# Patient Record
Sex: Female | Born: 1970 | Race: White | Hispanic: No | Marital: Married | Smoking: Never smoker
Health system: Southern US, Community
[De-identification: ages and names within clinical notes are randomized; demographics above are authoritative.]

## PROBLEM LIST (undated history)

## (undated) DIAGNOSIS — F32A Depression, unspecified: Secondary | ICD-10-CM

## (undated) DIAGNOSIS — G5600 Carpal tunnel syndrome, unspecified upper limb: Secondary | ICD-10-CM

## (undated) DIAGNOSIS — G43909 Migraine, unspecified, not intractable, without status migrainosus: Secondary | ICD-10-CM

## (undated) DIAGNOSIS — G4730 Sleep apnea, unspecified: Secondary | ICD-10-CM

## (undated) DIAGNOSIS — F329 Major depressive disorder, single episode, unspecified: Secondary | ICD-10-CM

## (undated) DIAGNOSIS — J302 Other seasonal allergic rhinitis: Secondary | ICD-10-CM

## (undated) HISTORY — DX: Sleep apnea, unspecified: G47.30

## (undated) HISTORY — DX: Carpal tunnel syndrome, unspecified upper limb: G56.00

## (undated) HISTORY — DX: Migraine, unspecified, not intractable, without status migrainosus: G43.909

---
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
## 2007-10-27 HISTORY — PX: CARPAL TUNNEL RELEASE: SHX101

## 2010-09-05 ENCOUNTER — Encounter: Admission: RE | Admit: 2010-09-05 | Discharge: 2010-09-05 | Payer: Self-pay | Admitting: Unknown Physician Specialty

## 2012-06-24 ENCOUNTER — Other Ambulatory Visit: Payer: Self-pay | Admitting: Family Medicine

## 2012-06-24 DIAGNOSIS — Z139 Encounter for screening, unspecified: Secondary | ICD-10-CM

## 2012-07-05 ENCOUNTER — Ambulatory Visit (INDEPENDENT_AMBULATORY_CARE_PROVIDER_SITE_OTHER): Payer: BC Managed Care – PPO | Admitting: Psychiatry

## 2012-07-05 ENCOUNTER — Encounter (HOSPITAL_COMMUNITY): Payer: Self-pay | Admitting: Psychiatry

## 2012-07-05 ENCOUNTER — Ambulatory Visit (INDEPENDENT_AMBULATORY_CARE_PROVIDER_SITE_OTHER): Payer: BC Managed Care – PPO

## 2012-07-05 ENCOUNTER — Other Ambulatory Visit (HOSPITAL_COMMUNITY): Payer: Self-pay | Admitting: Psychiatry

## 2012-07-05 VITALS — BP 128/86 | HR 72 | Ht 65.5 in | Wt 238.0 lb

## 2012-07-05 DIAGNOSIS — F4321 Adjustment disorder with depressed mood: Secondary | ICD-10-CM

## 2012-07-05 DIAGNOSIS — Z1231 Encounter for screening mammogram for malignant neoplasm of breast: Secondary | ICD-10-CM

## 2012-07-05 DIAGNOSIS — Z139 Encounter for screening, unspecified: Secondary | ICD-10-CM

## 2012-07-05 DIAGNOSIS — F4323 Adjustment disorder with mixed anxiety and depressed mood: Secondary | ICD-10-CM

## 2012-07-05 NOTE — Progress Notes (Signed)
Psychiatric Assessment Adult  Patient Identification:  Julie Schaef[place]  Date of Evaluation:  07/05/2012  Chief Complaint:  Chief Complaint  Patient presents with  . Medication Refill  . Fatigue  . Anxiety   History of Chief Complaint:   HPI Comments: Julie Schaefer  is a 41  y/o female with a past psychiatric history significant for Depression. NOS. The patient is referred for psychiatric services for psychiatric evaluation and medication management.   The patient reports that her main stressors are: "dad diagnosed with esophageal cancer"; "brother having lower back surgery"; "daughter moved out"; "job more demanding."   In the area of affective symptoms, patient appears anxious. Patient denies current suicidal ideation, intent, or plan. Patient denies current homicidal ideation, intent, or plan. Patient denies auditory hallucinations. Patient denies visual hallucinations. Patient denies symptoms of paranoia. Patient states sleep is poor, with approximately 7-8 hours of sleep per night. Appetite is good. Energy level is low. Patient denies symptoms of anhedonia. Patient denies hopelessness,but endorses helplessness, and guilt.   Denies any recent episodes consistent with mania, particularly decreased need for sleep with increased energy, grandiosity, impulsivity, hyperverbal and pressured speech, or increased productivity. Denies any recent symptoms consistent with psychosis, particularly auditory or visual hallucinations, thought broadcasting/insertion/withdrawal, or ideas of reference. Also denies excessive worry to the point of physical symptoms as well as any panic attacks. Denies any history of trauma or symptoms consistent with PTSD such as flashbacks, nightmares, hypervigilance, feelings of numbness or inability to connect with others.    Review of Systems  Constitutional: Positive for activity change. Negative for fever, chills, diaphoresis, appetite change, fatigue and unexpected weight  change.  Respiratory: Negative.   Cardiovascular: Positive for palpitations. Negative for chest pain and leg swelling.  Gastrointestinal: Negative.   Neurological: Positive for headaches. Negative for dizziness, tremors, seizures, syncope, facial asymmetry, speech difficulty, weakness, light-headedness and numbness.   Filed Vitals:   07/05/12 0922  BP: 128/86  Pulse: 72  Height: 5' 5.5" (1.664 m)  Weight: 238 lb (107.956 kg)   Physical Exam  Vitals reviewed. Constitutional: She appears well-developed and well-nourished. No distress.  Skin: She is not diaphoretic.    Traumatic Brain Injury: No  Past Psychiatric History: Diagnosis: Depression  Hospitalizations: Patient denies.  Outpatient Care: Patient denies.  Substance Abuse Care: Patient denies.  Self-Mutilation:Patient denies.  Suicidal Attempts: Patient denies.  Violent Behaviors: Patient denies.   Past Medical History:   Past Medical History  Diagnosis Date  . Carpal tunnel syndrome    History of Loss of Consciousness:  No Seizure History:  No Cardiac History:  No  Allergies:  No Known Allergies  Current Medications:  Current Outpatient Prescriptions  Medication Sig Dispense Refill  . cetirizine (ZYRTEC) 10 MG tablet Take 10 mg by mouth daily.      . citalopram (CELEXA) 40 MG tablet Take 40 mg by mouth Daily.      Marland Kitchen ibuprofen (ADVIL,MOTRIN) 200 MG tablet Take 200 mg by mouth every 6 (six) hours as needed.        Previous Psychotropic Medications:  Medication   Celexa- 1 year   Substance Abuse History in the last 12 months: Tobacco: Patient denies. Alcohol: Patient denies Illicit Drugs: Patient denies.  Medical Consequences of Substance Abuse: None  Legal Consequences of Substance Abuse: None  Family Consequences of Substance Abuse: None  Blackouts:  N/A DT's:  N/A Withdrawal Symptoms:  N/A  Social History: Current Place of Residence: [place], [place] Place of Birth: [place] Family  Members:Patient lives with her husband and 2 daughters.  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 Legal History: None Hobbies/Interests: Knitting. Taking care of everyone in her family.  Family History:   Family History  Problem Relation Age of Onset  . Esophageal cancer Father     Mental Status Examination/Evaluation: Objective:  Appearance: Casual and Well Groomed  Eye Contact::  Good  Speech:  Clear and Coherent and Normal Rate  Volume:  Normal  Mood:  "normal, easy going"  Affect:  Congruent and Full Range  Thought Process:  Coherent, Logical and Loose  Orientation:  Full  Thought Content:  WDL  Suicidal Thoughts:  No  Homicidal Thoughts:  No  Judgement:  Good  Insight:  Good  Psychomotor Activity:  Normal  Akathisia:  No  Handed:  Right  AIMS (if indicated):  Not indicated  Assets:  Communication Skills Desire for Improvement Financial Resources/Insurance Housing Intimacy Leisure Time Physical Health Resilience Social Support Talents/Skills Transportation Vocational/Educational    Laboratory/X-Ray Psychological Evaluation(s)   Normal  Normal   Assessment:  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 Carpal tunnel syndrome      AXIS IV problems with primary support group  AXIS V 51-60 moderate symptoms   Treatment Plan/Recommendations:  PLAN:  1. Affirm with the patient that the medications are taken as ordered. Patient expressed understanding of how their medications were to be used.  2. Continue the following psychiatric medications as written  prior to this appointment with the following changes:  a) Citalopram 40 mg daily 3. Therapy: brief supportive therapy provided. Continue current services.  4. Risks and benefits, side effects and alternatives discussed with patient, she was given an opportunity to ask questions about her medication, illness, and treatment. All current psychiatric medications have been reviewed and discussed with the patient and adjusted as clinically appropriate. The patient has been provided an accurate and updated list of the medications being now prescribed.  5. Patient told to call clinic if any problems occur. Patient advised to go to  ER  if s/he should develop SI/HI, side effects, or if symptoms worsen. Has crisis numbers to call if needed.   6. No labs warranted at this time.   7. The patient was encouraged to keep all PCP and specialty clinic appointments.  8. Patient was instructed to return to clinic in 1 month.  9. The patient was advised to call and cancel their mental health appointment within 24 hours of the appointment, if they are unable to keep the appointment, as well as the three no show and termination from clinic policy. 10. The patient expressed understanding of the plan and agrees with the above.   Jacqulyn Cane, MD 9/10/20139:15 AM

## 2012-07-06 LAB — CBC WITH DIFFERENTIAL/PLATELET
Basophils Absolute: 0 10*3/uL (ref 0.0–0.1)
Basophils Relative: 0 % (ref 0–1)
Eosinophils Absolute: 0.1 10*3/uL (ref 0.0–0.7)
Eosinophils Relative: 1 % (ref 0–5)
HCT: 41 % (ref 36.0–46.0)
MCH: 29.1 pg (ref 26.0–34.0)
MCHC: 33.4 g/dL (ref 30.0–36.0)
MCV: 87 fL (ref 78.0–100.0)
Monocytes Absolute: 0.3 10*3/uL (ref 0.1–1.0)
Platelets: 378 10*3/uL (ref 150–400)
RDW: 13.6 % (ref 11.5–15.5)
WBC: 5.6 10*3/uL (ref 4.0–10.5)

## 2012-07-06 LAB — VITAMIN D 25 HYDROXY (VIT D DEFICIENCY, FRACTURES): Vit D, 25-Hydroxy: 42 ng/mL (ref 30–89)

## 2012-07-06 LAB — THYROID PANEL WITH TSH
T3 Uptake: 35.3 % (ref 22.5–37.0)
T4, Total: 8.9 ug/dL (ref 5.0–12.5)

## 2012-07-07 DIAGNOSIS — F4321 Adjustment disorder with depressed mood: Secondary | ICD-10-CM | POA: Insufficient documentation

## 2012-07-09 LAB — VITAMIN D 1,25 DIHYDROXY: Vitamin D 1, 25 (OH)2 Total: 63 pg/mL (ref 18–72)

## 2012-07-12 ENCOUNTER — Telehealth (HOSPITAL_COMMUNITY): Payer: Self-pay | Admitting: Psychiatry

## 2012-07-12 NOTE — Telephone Encounter (Signed)
Called patient regarding lab results. Advised addition of Vitamin D3 1000 IU daily to augment antidepressant.

## 2012-07-18 ENCOUNTER — Telehealth (HOSPITAL_COMMUNITY): Payer: Self-pay | Admitting: Psychiatry

## 2012-07-18 NOTE — Telephone Encounter (Signed)
Erroneous encounter

## 2012-07-25 ENCOUNTER — Ambulatory Visit (INDEPENDENT_AMBULATORY_CARE_PROVIDER_SITE_OTHER): Payer: BC Managed Care – PPO | Admitting: Psychiatry

## 2012-07-25 ENCOUNTER — Encounter (HOSPITAL_COMMUNITY): Payer: Self-pay | Admitting: Psychiatry

## 2012-07-25 VITALS — BP 119/79 | HR 69 | Ht 65.5 in | Wt 240.0 lb

## 2012-07-25 DIAGNOSIS — F4323 Adjustment disorder with mixed anxiety and depressed mood: Secondary | ICD-10-CM

## 2012-07-25 DIAGNOSIS — F4321 Adjustment disorder with depressed mood: Secondary | ICD-10-CM

## 2012-07-25 NOTE — Progress Notes (Signed)
[facility] Follow-up Outpatient Visit  Julie Schaef[place] 1971-04-13  Date: 07/25/2012  Chief Complaint:  Follow up  History of Chief Complaint:   HPI Comments: Julie Schaefer is a 41 y/o female with a past psychiatric history significant for Depression. NOS. The patient is referred for psychiatric services for medication management.   The patient report that her father is doing well and continues chemotherapy. Her brother has completed his back surgery and is recovering and she is now working full time. She reports that she has some continue marital stressors. She also continues to have difficulty saying "no" and appears to get involved with too many activities giving herself little personal time.   In the area of affective symptoms, patient appears anxious. Patient denies current suicidal ideation, intent, or plan. Patient denies current homicidal ideation, intent, or plan. Patient denies auditory hallucinations. Patient denies visual hallucinations. Patient denies symptoms of paranoia. Patient states sleep is poor, with approximately 7-8 hours of sleep per night. Appetite is good. Energy level is low. Patient denies symptoms of anhedonia. Patient denies hopelessness, and helplessness, and endorses guilt.   Denies any recent episodes consistent with mania, particularly decreased need for sleep with increased energy, grandiosity, impulsivity, hyperverbal and pressured speech, or increased productivity. Denies any recent symptoms consistent with psychosis, particularly auditory or visual hallucinations, thought broadcasting/insertion/withdrawal, or ideas of reference. Also denies excessive worry to the point of physical symptoms as well as any panic attacks. Denies any history of trauma or symptoms consistent with PTSD such as flashbacks, nightmares, hypervigilance, feelings of numbness or inability to connect with others.   Review of Systems  Constitutional: Positive for activity change.  Negative for fever, chills, diaphoresis, appetite change, fatigue and unexpected weight change.  Respiratory: Negative.  Cardiovascular: Negative for chest pain, palpitations, and leg swelling.  Gastrointestinal: Negative.  Neurological: Positive for headaches. Negative for dizziness, tremors, seizures, syncope, facial asymmetry, speech difficulty, weakness, light-headedness and numbness.   Filed Vitals:   07/25/12 1011  BP: 119/79  Pulse: 69  Height: 5' 5.5" (1.664 m)  Weight: 240 lb (108.863 kg)   Physical Exam  Vitals reviewed.  Constitutional: She appears well-developed and well-nourished. No distress.  Skin: She is not diaphoretic.   Traumatic Brain Injury: No   Past Psychiatric History: Reviewed Diagnosis: Depression   Hospitalizations: Patient denies.   Outpatient Care: Patient denies.   Substance Abuse Care: Patient denies.   Self-Mutilation:Patient denies.   Suicidal Attempts: Patient denies.   Violent Behaviors: Patient denies.    Past Medical History: Reviewed Past Medical History   Diagnosis  Date   .  Carpal tunnel syndrome     History of Loss of Consciousness: No  Seizure History: No  Cardiac History: No   Allergies: No Known Allergies   Current Medications: Reviewed Current Outpatient Prescriptions   Medication  Sig  Dispense  Refill   .  cetirizine (ZYRTEC) 10 MG tablet  Take 10 mg by mouth daily.     .  citalopram (CELEXA) 40 MG tablet  Take 40 mg by mouth Daily.     Marland Kitchen  ibuprofen (ADVIL,MOTRIN) 200 MG tablet  Take 200 mg by mouth every 6 (six) hours as needed.      Previous Psychotropic Medications: Reviewed Medication   Celexa- 1 year    Substance Abuse History in the last 12 months: Reviewed Tobacco: Patient denies.  Alcohol: Patient denies  Illicit Drugs: Patient denies.   Medical Consequences of Substance Abuse: None  Legal Consequences of  Substance Abuse: None  Family Consequences of Substance Abuse: None  Blackouts: N/A  DT's: N/A    Withdrawal Symptoms: N/A   Social History: Reviewed Current Place of Residence: [place], [place]  Place of Birth: [place], [place]  Family Members:Patient lives with her husband and 2 daughters. 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  Legal History: None  Hobbies/Interests: Knitting. Taking care of everyone in her family.   Family History: Reviewed Family History   Problem  Relation  Age of Onset   .  Esophageal cancer  Father     Mental Status Examination/Evaluation:  Objective: Appearance: Casual and Well Groomed   Eye Contact:: Good   Speech: Clear and Coherent and Normal Rate   Volume: Normal   Mood: "good"   Affect: Congruent and Full Range   Thought Process: Coherent, Logical and Loose   Orientation: Full   Thought Content: WDL   Suicidal Thoughts: No   Homicidal Thoughts: No   Judgement: Good   Insight: Good   Psychomotor Activity: Normal   Akathisia: No   Handed: Right   Memory; Intact immediate and recent.  Assets: Communication Skills  Desire for Improvement  Financial Resources/Insurance  Housing  Intimacy  Leisure Time  Physical Health  Resilience  Social Support  Talents/Skills  Transportation  Vocational/Educational    Laboratory/X-Ray  Psychological Evaluation(s)   Normal  Normal    Assessment:  AXIS I  Adjustment Disorder with Mixed Emotional Features   AXIS II  No diagnosis   AXIS III  Past Medical History    Diagnosis  Date    .  Carpal tunnel syndrome       AXIS IV  problems with primary support group   AXIS V  51-60 moderate symptoms    Treatment Plan/Recommendations:  PLAN:  1. Affirm with the patient that  the medications are taken as ordered. Patient expressed understanding of how their medications were to be used.  2. Continue the following psychiatric medications as written prior to this appointment with the following changes:  a) Citalopram 40 mg daily-Patient has a 90 day supply. 3. Therapy: brief supportive therapy provided. Continue current services.  4. Risks and benefits, side effects and alternatives discussed with patient, she was given an opportunity to ask questions about her medication, illness, and treatment. All current psychiatric medications have been reviewed and discussed with the patient and adjusted as clinically appropriate. The patient has been provided an accurate and updated list of the medications being now prescribed.  5. Patient told to call clinic if any problems occur. Patient advised to go to ER if she should develop SI/HI, side effects, or if symptoms worsen. Has crisis numbers to call if needed.  6. No labs warranted at this time.  7. The patient was encouraged to keep all PCP and specialty clinic appointments.  8. Patient was instructed to return to clinic in 1 month.  9. The patient was advised to call and cancel their mental health appointment within 24 hours of the appointment, if they are unable to keep the appointment, as well as the three no show and termination from clinic policy.  10. The patient expressed understanding of the plan and agrees with the above.  Jacqulyn Cane, MD

## 2012-08-26 ENCOUNTER — Encounter (HOSPITAL_COMMUNITY): Payer: Self-pay | Admitting: Psychiatry

## 2012-08-26 ENCOUNTER — Ambulatory Visit (INDEPENDENT_AMBULATORY_CARE_PROVIDER_SITE_OTHER): Payer: BC Managed Care – PPO | Admitting: Psychiatry

## 2012-08-26 VITALS — BP 134/93 | HR 69 | Ht 65.0 in | Wt 236.0 lb

## 2012-08-26 DIAGNOSIS — F4323 Adjustment disorder with mixed anxiety and depressed mood: Secondary | ICD-10-CM

## 2012-08-26 MED ORDER — CITALOPRAM HYDROBROMIDE 40 MG PO TABS: 40.0000 mg | ORAL_TABLET | Freq: Every day | ORAL | Status: DC

## 2012-08-26 NOTE — Progress Notes (Signed)
[facility] Follow-up Outpatient Visit  Julie Schaefer 1971/05/28  Date: 08/26/2012  Chief Complaint  Patient presents with  . Follow-up   History of Chief Complaint:   HPI Comments: Ms. Hantz is a 41 y/o female with a past psychiatric history significant for Depression. NOS. The patient is referred for psychiatric services for medication management.   The patient reports that her father and brother are doing well.  She reports that she continues to stretch herself which causes her distress at times.  She continues to have difficulty with limiting her involvement in the care of her family.  She states her husband continues to have some difficulty with her relationship with her friend.  She states she is concerned about her husband's interested in joining the seminary which would require him to move to [place].  She has decided to stay in [place], no matter what her husband decides as her job and family are in the area.  In the area of affective symptoms, patient appears mildly anxious. Patient denies current suicidal ideation, intent, or plan. Patient denies current homicidal ideation, intent, or plan. Patient denies auditory hallucinations. Patient denies visual hallucinations. Patient denies symptoms of paranoia. Patient states sleep is poor, with approximately 7-8 hours of sleep per night. Appetite is good. Energy level is low. Patient denies symptoms of anhedonia. Patient denies hopelessness, and helplessness, but endorses guilt.   Denies any recent episodes consistent with mania, particularly decreased need for sleep with increased energy, grandiosity, impulsivity, hyperverbal and pressured speech, or increased productivity. Denies any recent symptoms consistent with psychosis, particularly auditory or visual hallucinations, thought broadcasting/insertion/withdrawal, or ideas of reference. Also denies excessive worry to the point of physical symptoms as well as any panic  attacks. Denies any history of trauma or symptoms consistent with PTSD such as flashbacks, nightmares, hypervigilance, feelings of numbness or inability to connect with others.   Review of Systems  Constitutional: Positive for activity change. Negative for fever, chills, diaphoresis, appetite change, fatigue and unexpected weight change.  Respiratory: Negative.  Cardiovascular: Negative for chest pain, palpitations, and leg swelling.  Gastrointestinal: Negative.  Neurological: Positive for headaches. Negative for dizziness, tremors, seizures, syncope, facial asymmetry, speech difficulty, weakness, light-headedness and numbness.    Filed Vitals:   08/26/12 1009  BP: 134/93  Pulse: 69  Height: 5\' 5"  (1.651 m)  Weight: 236 lb (107.049 kg)   Physical Exam  Vitals reviewed.  Constitutional: She appears well-developed and well-nourished. No distress.  Skin: She is not diaphoretic.  Traumatic Brain Injury: No   Past Psychiatric History: Reviewed  Diagnosis: Depression   Hospitalizations: Patient denies.   Outpatient Care: Patient denies.   Substance Abuse Care: Patient denies.   Self-Mutilation:Patient denies.   Suicidal Attempts: Patient denies.   Violent Behaviors: Patient denies.    Past Medical History: Reviewed  Past Medical History  Diagnosis Date  . Carpal tunnel syndrome    History of Loss of Consciousness: No  Seizure History: No  Cardiac History: No   Allergies: No Known Allergies   Current Medications: Reviewed  Current Outpatient Prescriptions on File Prior to Visit  Medication Sig Dispense Refill  . cetirizine (ZYRTEC) 10 MG tablet Take 10 mg by mouth daily.      . citalopram (CELEXA) 40 MG tablet Take 40 mg by mouth Daily.      Marland Kitchen ibuprofen (ADVIL,MOTRIN) 200 MG tablet Take 200 mg by mouth every 6 (six) hours as needed.      . naproxen sodium (  ANAPROX) 220 MG tablet Take 220 mg by mouth 2 (two) times daily between meals as needed.       . SUMAtriptan (IMITREX)  100 MG tablet Take 100 mg by mouth Once daily as needed.        Previous Psychotropic Medications: Reviewed  Medication   Celexa- 1 year    Substance Abuse History in the last 12 months: Reviewed  Tobacco: Patient denies.  Alcohol: Patient denies  Illicit Drugs: Patient denies.   Medical Consequences of Substance Abuse: None  Legal Consequences of Substance Abuse: None  Family Consequences of Substance Abuse: None  Blackouts: N/A  DT's: N/A  Withdrawal Symptoms: N/A   Social History: Reviewed  Current Place of Residence: [place], [place]  Place of Birth: [place], [place]  Family Members:Patient lives with her husband and 2 daughters. 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  Legal History: None  Hobbies/Interests: Knitting. Taking care of everyone in her family.   Family History: Reviewed  Family History  Problem Relation Age of Onset  . Esophageal cancer Father   . Stroke Father   . Coronary artery disease Father   . Hypertension Father   . Heart attack Father   . Depression Mother   . Carpal tunnel syndrome Daughter     Mental Status Examination/Evaluation:  Objective: Appearance: Casual and Well Groomed   Eye Contact:: Good   Speech: Clear and Coherent and Normal Rate   Volume: Normal   Mood: "good"   Affect: Congruent and Full Range   Thought Process: Coherent, Logical and Loose   Orientation: Full   Thought Content: WDL   Suicidal Thoughts: No   Homicidal Thoughts: No   Judgement: Good   Insight: Good   Psychomotor Activity: Normal   Akathisia: No   Handed: Right   Memory; Intact immediate and recent.   Assets: Communication  Skills  Desire for Improvement  Financial Resources/Insurance  Housing  Intimacy  Leisure Time  Physical Health  Resilience  Social Support  Talents/Skills  Transportation  Vocational/Educational    Laboratory/X-Ray  Psychological Evaluation(s)   Normal  Normal    Assessment:  AXIS I  Adjustment Disorder with Mixed Emotional Features   AXIS II  No diagnosis   AXIS III  Past Medical History    Diagnosis  Date    .  Carpal tunnel syndrome       AXIS IV  problems with primary support group   AXIS V  51-60 moderate symptoms   Treatment Plan/Recommendations:   PLAN:  1. Affirm with the patient that the medications are taken as ordered. Patient expressed understanding of how their medications were to be used.  2. Continue the following psychiatric medications as written prior to this appointment with the following changes:  a) Citalopram 40 mg daily-Patient has a 90 day supply.  3. Therapy: brief supportive therapy provided. Discussed psychosocial stressors. 4. Risks and benefits, side effects and alternatives discussed with patient, she was given an opportunity to ask questions about her medication, illness, and treatment. All current psychiatric medications have been reviewed and discussed with the patient and adjusted as clinically appropriate. The patient has been provided an accurate and updated list of the medications being now prescribed.  5. Patient told to call clinic if any problems occur. Patient advised to go to ER if she should develop SI/HI, side effects, or if symptoms worsen. Has crisis numbers to call if needed.  6. No labs warranted at this time.  7. The patient was encouraged to keep all PCP and specialty clinic appointments.  8. Patient was instructed to return to clinic in 1 month.  9. The patient was advised to call and cancel their mental health appointment within 24 hours of the appointment, if they are unable to keep the appointment, as well as the three no  show and termination from clinic policy.  10. The patient expressed understanding of the plan and agrees with the above.    Jacqulyn Cane, MD

## 2012-09-28 ENCOUNTER — Encounter (HOSPITAL_COMMUNITY): Payer: Self-pay | Admitting: Psychiatry

## 2012-09-28 ENCOUNTER — Ambulatory Visit (INDEPENDENT_AMBULATORY_CARE_PROVIDER_SITE_OTHER): Payer: BC Managed Care – PPO | Admitting: Psychiatry

## 2012-09-28 VITALS — BP 121/81 | HR 82 | Ht 65.0 in | Wt 240.0 lb

## 2012-09-28 DIAGNOSIS — F4323 Adjustment disorder with mixed anxiety and depressed mood: Secondary | ICD-10-CM

## 2012-09-28 MED ORDER — CITALOPRAM HYDROBROMIDE 40 MG PO TABS: 40.0000 mg | ORAL_TABLET | Freq: Every day | ORAL | Status: DC

## 2012-09-28 NOTE — Progress Notes (Signed)
[facility]  Julie Schaefer August 01, 1971  Date: 09/28/2012  Chief Complaint   Patient presents with   .  Follow-up    History of Chief Complaint:  HPI Comments: Julie Schaefer is a 41 y/o female with a past psychiatric history significant for Depression. NOS. The patient is referred for psychiatric services for medication management.   The patient reports that her father condition has shown some worsening but her brother is doing well. She endorses taking more time for herself. She states she is feeling a little less stressed.  She does endorse continued marital problems related to communication which has not been addressed. She states that her husband continues to endorse interest in seminary training which would take him from the area, but she has not decided whether she would go with him. She admits to several instances where a break down in their communication leads to both of them unintentionally yelling at each other.   In the area of affective symptoms, patient appears mildly anxious. Patient denies current suicidal ideation, intent, or plan. Patient denies current homicidal ideation, intent, or plan. Patient denies auditory hallucinations. Patient denies visual hallucinations. Patient denies symptoms of paranoia. Patient states sleep is poor, with approximately 9 hours of sleep per night. Appetite is good. Energy level is improved. Patient endorses some irritability.  Patient denies symptoms of anhedonia. Patient reports some hopelessness, and helplessness, but endorses guilt.   Denies any recent episodes consistent with mania, particularly decreased need for sleep with increased energy, grandiosity, impulsivity, hyperverbal and pressured speech, or increased productivity. Denies any recent symptoms consistent with psychosis, particularly auditory or visual hallucinations, thought broadcasting/insertion/withdrawal, or ideas of reference. Also denies excessive  worry to the point of physical symptoms as well as any panic attacks. Denies any history of trauma or symptoms consistent with PTSD such as flashbacks, nightmares, hypervigilance, feelings of numbness or inability to connect with others.   Review of Systems  Constitutional: Positive for activity change. Negative for fever, chills, diaphoresis, appetite change, fatigue and unexpected weight change.  Respiratory: Negative.  Cardiovascular: Negative for chest pain, palpitations, and leg swelling.  Gastrointestinal: Negative.  Neurological: Positive for headaches. Negative for dizziness, tremors, seizures, syncope, facial asymmetry, speech difficulty, weakness, light-headedness and numbness.   Filed Vitals:   09/28/12 1616  BP: 121/81  Pulse: 82  Height: 5\' 5"  (1.651 m)  Weight: 240 lb (108.863 kg)   Physical Exam  Vitals reviewed.  Constitutional: She appears well-developed and well-nourished. No distress.  Skin: She is not diaphoretic.  Traumatic Brain Injury: No   Past Psychiatric History: Reviewed  Diagnosis: Depression   Hospitalizations: Patient denies.   Outpatient Care: Patient denies.   Substance Abuse Care: Patient denies.   Self-Mutilation:Patient denies.   Suicidal Attempts: Patient denies.   Violent Behaviors: Patient denies.    Past Medical History: Reviewed  Past Medical History  Diagnosis Date  . Carpal tunnel syndrome   . Migraine    History of Loss of Consciousness: No  Seizure History: No  Cardiac History: No   Allergies: No Known Allergies   Current Medications: Reviewed  Current Outpatient Prescriptions on File Prior to Visit  Medication Sig Dispense Refill  . cetirizine (ZYRTEC) 10 MG tablet Take 10 mg by mouth daily.      . citalopram (CELEXA) 40 MG tablet Take 1 tablet (40 mg total) by mouth daily.  90 tablet  1  . ibuprofen (ADVIL,MOTRIN) 200 MG tablet Take 200 mg by mouth every  6 (six) hours as needed.      . naproxen sodium (ANAPROX) 220 MG  tablet Take 220 mg by mouth 2 (two) times daily between meals as needed.       . SUMAtriptan (IMITREX) 100 MG tablet Take 100 mg by mouth Once daily as needed.        Previous Psychotropic Medications: Reviewed  Medication   Celexa- 1 year    Substance Abuse History in the last 12 months: Reviewed  Tobacco: Patient denies.  Alcohol: Patient denies  Illicit Drugs: Patient denies.   Medical Consequences of Substance Abuse: None  Legal Consequences of Substance Abuse: None  Family Consequences of Substance Abuse: None  Blackouts: N/A  DT's: N/A  Withdrawal Symptoms: N/A   Social History: Reviewed  Current Place of Residence: [place], [place]  Place of Birth: [place], [place]  Family Members:Patient lives with her husband and 2 daughters. 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  Legal History: None  Hobbies/Interests: Knitting. Taking care of everyone in her family.   Family History: Reviewed  Family History  Problem Relation Age of Onset  . Esophageal cancer Father   . Stroke Father   . Coronary artery disease Father   . Hypertension Father   . Heart attack Father   . Depression Mother   . Carpal tunnel syndrome Daughter     Mental Status Examination/Evaluation:  Objective: Appearance: Casual and Well Groomed   Eye Contact:: Good   Speech: Clear and Coherent and Normal Rate   Volume: Normal   Mood: "okay"  3/10  Affect: Incongruent and Full Range   Thought Process: Coherent, Logical and Loose   Orientation: Full   Thought Content: WDL   Suicidal Thoughts: No   Homicidal Thoughts: No   Judgement: Good   Insight: Good   Psychomotor Activity:  Normal   Akathisia: No   Handed: Right   Memory: Intact immediate and recent.   Assets: Communication Skills  Desire for Improvement  Financial Resources/Insurance  Housing  Intimacy  Leisure Time  Physical Health  Resilience  Social Support  Talents/Skills  Transportation  Vocational/Educational    Laboratory/X-Ray  Psychological Evaluation(s)   Normal  Normal    Assessment:  AXIS I  Adjustment Disorder with Mixed Emotional Features   AXIS II  No diagnosis   AXIS III  Past Medical History    Diagnosis  Date    .  Carpal tunnel syndrome       AXIS IV  problems with primary support group   AXIS V  51-60 moderate symptoms    Treatment Plan/Recommendations:  PLAN:  1. Affirm with the patient that the medications are taken as ordered. Patient expressed understanding of how their medications were to be used.  2. Continue the following psychiatric medications as written prior to this appointment with the following changes:  a) Citalopram 40 mg daily-Patient has a 90 day supply.  3. Therapy: brief supportive therapy provided. Discussed psychosocial stressors. Discussed need for marital counseling to improve communication between the patient and her spouse as this is a significant stressor in the patient's life.  4. Risks and benefits, side effects and alternatives discussed with patient, she was given an opportunity to ask questions about  her medication, illness, and treatment. All current psychiatric medications have been reviewed and discussed with the patient and adjusted as clinically appropriate. The patient has been provided an accurate and updated list of the medications being now prescribed.  5. Patient told to call clinic if any problems occur. Patient advised to go to ER if she should develop SI/HI, side effects, or if symptoms worsen. Has crisis numbers to call if needed.  6. No labs warranted at this time.  7. The patient was encouraged to keep all PCP and specialty clinic  appointments.  8. Patient was instructed to return to clinic in 6 weeks.  9. The patient was advised to call and cancel their mental health appointment within 24 hours of the appointment, if they are unable to keep the appointment, as well as the three no show and termination from clinic policy.  10. The patient expressed understanding of the plan and agrees with the above.   Jacqulyn Cane, MD

## 2012-11-14 ENCOUNTER — Encounter (HOSPITAL_COMMUNITY): Payer: Self-pay | Admitting: Psychiatry

## 2012-11-14 ENCOUNTER — Ambulatory Visit (INDEPENDENT_AMBULATORY_CARE_PROVIDER_SITE_OTHER): Payer: BC Managed Care – PPO | Admitting: Psychiatry

## 2012-11-14 VITALS — BP 134/90 | HR 68 | Ht 65.0 in | Wt 241.0 lb

## 2012-11-14 DIAGNOSIS — F4323 Adjustment disorder with mixed anxiety and depressed mood: Secondary | ICD-10-CM

## 2012-11-14 DIAGNOSIS — F4321 Adjustment disorder with depressed mood: Secondary | ICD-10-CM

## 2012-11-14 NOTE — Progress Notes (Signed)
[facility] Follow-up Outpatient Visit  Julie Schaefer 1971/10/26  Date: 11/14/12  Chief Complaint   Patient presents with   .  Follow-up    History of Chief Complaint:  HPI Comments: Julie Schaefer is a 42 y/o female with a past psychiatric history significant for Depression. NOS. The patient is referred for psychiatric services for medication management.   The patient report her father passed away 10-14-2012, but the patient reports that she and his friend were there.  The patient states that she takes some solace in the fact that her father will no longer have to suffer physically. The patient states she is taking her medications an denies any side effects.   In the area of affective symptoms, patient appears mildly anxious. Patient denies current suicidal ideation, intent, or plan. Patient denies current homicidal ideation, intent, or plan. Patient denies auditory hallucinations. Patient denies visual hallucinations. Patient denies symptoms of paranoia. Patient states sleep is improved, with approximately 9 hours of sleep per night. Appetite is good. Energy level is low. Patient endorses less irritability.  Patient denies symptoms of anhedonia. Patient denieshopelessness, and helplessness, but guilt.   Denies any recent episodes consistent with mania, particularly decreased need for sleep with increased energy, grandiosity, impulsivity, hyperverbal and pressured speech, or increased productivity. Denies any recent symptoms consistent with psychosis, particularly auditory or visual hallucinations, thought broadcasting/insertion/withdrawal, or ideas of reference. Also denies excessive worry to the point of physical symptoms as well as any panic attacks. Denies any history of trauma or symptoms consistent with PTSD such as flashbacks, nightmares, hypervigilance, feelings of numbness or inability to connect with others.   Review of Systems  Constitutional: Positive for activity  change. Negative for fever, chills, diaphoresis, appetite change, fatigue and unexpected weight change.  Respiratory: Negative.  Cardiovascular: Negative for chest pain, palpitations, and leg swelling.  Gastrointestinal: Negative.  Neurological: Positive for headaches. Negative for dizziness, tremors, seizures, syncope, facial asymmetry, speech difficulty, weakness, light-headedness and numbness.   Filed Vitals:   11/14/12 1006  BP: 134/90  Pulse: 68  Height: 5\' 5"  (1.651 m)  Weight: 241 lb (109.317 kg)   Physical Exam  Vitals reviewed.  Constitutional: She appears well-developed and well-nourished. No distress.  Skin: She is not diaphoretic.  Traumatic Brain Injury: No   Past Psychiatric History: Reviewed  Diagnosis: Depression   Hospitalizations: Patient denies.   Outpatient Care: Patient denies.   Substance Abuse Care: Patient denies.   Self-Mutilation:Patient denies.   Suicidal Attempts: Patient denies.   Violent Behaviors: Patient denies.    Past Medical History: Reviewed  Past Medical History  Diagnosis Date  . Carpal tunnel syndrome   . Migraine    History of Loss of Consciousness: No  Seizure History: No  Cardiac History: No   Allergies: No Known Allergies   Current Medications: Reviewed  Current Outpatient Prescriptions on File Prior to Visit  Medication Sig Dispense Refill  . citalopram (CELEXA) 40 MG tablet Take 1 tablet (40 mg total) by mouth daily.  90 tablet  1  . ibuprofen (ADVIL,MOTRIN) 200 MG tablet Take 200 mg by mouth every 6 (six) hours as needed.      . cetirizine (ZYRTEC) 10 MG tablet Take 10 mg by mouth daily.      . SUMAtriptan (IMITREX) 100 MG tablet Take 100 mg by mouth Once daily as needed.        Previous Psychotropic Medications: Reviewed  Medication   Celexa- 1 year    Substance  Abuse History in the last 12 months: Reviewed  Tobacco: Patient denies.  Alcohol: Patient denies  Illicit Drugs: Patient denies.   Medical Consequences  of Substance Abuse: None  Legal Consequences of Substance Abuse: None  Family Consequences of Substance Abuse: None  Blackouts: N/A  DT's: N/A  Withdrawal Symptoms: N/A   Social History: Reviewed  Current Place of Residence: [place], [place]  Place of Birth: [place], [place]  Family Members:Patient lives with her husband and 2 daughters. 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  Legal History: None  Hobbies/Interests: Knitting. Taking care of everyone in her family.   Family History: Reviewed  Family History  Problem Relation Age of Onset  . Esophageal cancer Father   . Stroke Father   . Coronary artery disease Father   . Hypertension Father   . Heart attack Father   . Depression Mother   . Carpal tunnel syndrome Daughter     Mental Status Examination/Evaluation:  Objective: Appearance: Casual and Well Groomed   Eye Contact:: Good   Speech: Clear and Coherent and Normal Rate   Volume: Normal   Mood: "hurried"  5/10  Affect: Incongruent and Full Range   Thought Process: Coherent, Logical and Loose   Orientation: Full   Thought Content: WDL   Suicidal Thoughts: No   Homicidal Thoughts: No   Judgement: Good   Insight: Good   Psychomotor Activity: Normal   Akathisia: No   Handed: Right   Memory: Intact immediate and recent.   Assets: Communication Skills  Desire for Improvement  Financial Resources/Insurance  Housing  Intimacy  Leisure Time  Physical Health  Resilience  Social Support  Talents/Skills  Transportation  Vocational/Educational    Laboratory/X-Ray  Psychological Evaluation(s)   Normal  Normal    Assessment:  AXIS I   Adjustment Disorder with Mixed Emotional Features   AXIS II  No diagnosis   AXIS III  Past Medical History    Diagnosis  Date    .  Carpal tunnel syndrome       AXIS IV  problems with primary support group   AXIS V  51-60 moderate symptoms    Treatment Plan/Recommendations:  PLAN:  1. Affirm with the patient that the medications are taken as ordered. Patient expressed understanding of how their medications were to be used.  2. Continue the following psychiatric medications as written prior to this appointment with the following changes:  a) Citalopram 40 mg daily-Patient has a 90 day supply.  3. Therapy: brief supportive therapy provided. Discussed psychosocial stressors. Discussed need for marital counseling to improve communication between the patient and her spouse as this is a significant stressor in the patient's life.  4. Risks and benefits, side effects and alternatives discussed with patient, she was given an opportunity to ask questions about her medication, illness, and treatment. All current psychiatric medications have been reviewed and discussed with the patient and adjusted as clinically appropriate. The patient has been provided an accurate and updated list of the medications being now prescribed.  5. Patient told to call clinic if any problems occur. Patient advised to go to ER if she should develop SI/HI, side effects, or if symptoms worsen. Has crisis numbers to call if needed.  6. No labs warranted at this time.  7. The patient was encouraged to keep all PCP and specialty clinic appointments.  8. Patient was instructed to return to clinic in 8 weeks.  9. The patient was advised to call and cancel their mental health appointment within 24 hours of the appointment, if they are unable to keep the appointment, as well as the three no show and termination from clinic policy.  10. The patient expressed understanding of the plan and agrees with the above.   Jacqulyn Cane, MD

## 2013-01-11 ENCOUNTER — Ambulatory Visit (HOSPITAL_COMMUNITY): Payer: Self-pay | Admitting: Psychiatry

## 2013-01-17 ENCOUNTER — Encounter (HOSPITAL_COMMUNITY): Payer: Self-pay | Admitting: Psychiatry

## 2013-01-17 ENCOUNTER — Ambulatory Visit (INDEPENDENT_AMBULATORY_CARE_PROVIDER_SITE_OTHER): Payer: BC Managed Care – PPO | Admitting: Psychiatry

## 2013-01-17 VITALS — BP 125/86 | HR 84 | Ht 65.0 in | Wt 246.0 lb

## 2013-01-17 DIAGNOSIS — F4323 Adjustment disorder with mixed anxiety and depressed mood: Secondary | ICD-10-CM

## 2013-01-17 NOTE — Progress Notes (Signed)
[facility] Follow-up Outpatient Visit  Julie Schaefer 1971/04/25  Date: 01/17/2013  Chief Complaint   Patient presents with   .  Follow-up    History of Chief Complaint:  HPI Comments: Julie Schaefer is a 42 y/o female with a past psychiatric history significant for Depression. NOS. The patient is referred for psychiatric services for medication management.   The patient reports that she is still grieving her father's death. She feels continuously "rushed" in life but  She admits she seems to fill her time with few periods for rest and personal enjoyment. She reports she is taking her medications and denies any side effects.   In the area of affective symptoms, patient appears mildly anxious. Patient denies current suicidal ideation, intent, or plan. Patient denies current homicidal ideation, intent, or plan. Patient denies auditory hallucinations. Patient denies visual hallucinations. Patient denies symptoms of paranoia. Patient states sleep is improved, with approximately 8 hours of sleep per night. Appetite is increase. Energy level is low. Patient endorses some irritability.  Patient denies symptoms of anhedonia. Patient denies hopelessness, and helplessness, but guilt.   Denies any recent episodes consistent with mania, particularly decreased need for sleep with increased energy, grandiosity, impulsivity, hyperverbal and pressured speech, or increased productivity. Denies any recent symptoms consistent with psychosis, particularly auditory or visual hallucinations, thought broadcasting/insertion/withdrawal, or ideas of reference. Also denies excessive worry to the point of physical symptoms as well as any panic attacks. Denies any history of trauma or symptoms consistent with PTSD such as flashbacks, nightmares, hypervigilance, feelings of numbness or inability to connect with others.   Review of Systems  Constitutional: Positive for activity change. Negative for fever, chills,  diaphoresis, appetite change, fatigue and unexpected weight change.  Respiratory: Negative.  Cardiovascular: Negative for chest pain, palpitations, and leg swelling.  Gastrointestinal: Negative.  Neurological: Positive for headaches. Negative for dizziness, tremors, seizures, syncope, facial asymmetry, speech difficulty, weakness, light-headedness and numbness.   Filed Vitals:   01/17/13 1635  BP: 125/86  Pulse: 84  Height: 5\' 5"  (1.651 m)  Weight: 246 lb (111.585 kg)   Physical Exam  Vitals reviewed.  Constitutional: She appears well-developed and well-nourished. No distress.  Skin: She is not diaphoretic.  Traumatic Brain Injury: No   Past Psychiatric History: Reviewed  Diagnosis: Depression   Hospitalizations: Patient denies.   Outpatient Care: Patient denies.   Substance Abuse Care: Patient denies.   Self-Mutilation:Patient denies.   Suicidal Attempts: Patient denies.   Violent Behaviors: Patient denies.    Past Medical History: Reviewed  Past Medical History  Diagnosis Date  . Carpal tunnel syndrome   . Migraine    History of Loss of Consciousness: No  Seizure History: No  Cardiac History: No   Allergies: No Known Allergies   Current Medications: Reviewed  Current Outpatient Prescriptions on File Prior to Visit  Medication Sig Dispense Refill  . cetirizine (ZYRTEC) 10 MG tablet Take 10 mg by mouth daily.      . citalopram (CELEXA) 40 MG tablet Take 1 tablet (40 mg total) by mouth daily.  90 tablet  1  . SUMAtriptan (IMITREX) 100 MG tablet Take 100 mg by mouth Once daily as needed.       No current facility-administered medications on file prior to visit.    Previous Psychotropic Medications: Reviewed  Medication   Celexa- 1 year    Substance Abuse History in the last 12 months: Reviewed  Caffeine: Patient  Tobacco: Patient denies.  Alcohol: Patient denies  Illicit Drugs: Patient denies.   Medical Consequences of Substance Abuse: None  Legal  Consequences of Substance Abuse: None  Family Consequences of Substance Abuse: None  Blackouts: N/A  DT's: N/A  Withdrawal Symptoms: N/A   Social History: Reviewed  Current Place of Residence: [place], [place]  Place of Birth: [place], [place]  Family Members:Patient lives with her husband and 2 daughters. 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  Legal History: None  Hobbies/Interests: Knitting. Taking care of everyone in her family.   Family History: Reviewed  Family History  Problem Relation Age of Onset  . Esophageal cancer Father   . Stroke Father   . Coronary artery disease Father   . Hypertension Father   . Heart attack Father   . Depression Mother   . Carpal tunnel syndrome Daughter     Mental Status Examination/Evaluation:  Objective: Appearance: Casual and Well Groomed   Eye Contact:: Good   Speech: Clear and Coherent and Normal Rate   Volume: Normal   Mood: "anxious"  5/10  Affect: Incongruent and Full Range   Thought Process: Coherent, Logical and Loose   Orientation: Full   Thought Content: WDL   Suicidal Thoughts: No   Homicidal Thoughts: No   Judgement: Good   Insight: Good   Psychomotor Activity: Normal   Akathisia: No   Handed: Right   Memory: Intact immediate and recent. (donot   Assets: Communication Skills  Desire for Improvement  Financial Resources/Insurance  Housing  Intimacy  Leisure Time  Physical Health  Resilience  Social Support  Talents/Skills  Transportation  Vocational/Educational    Laboratory/X-Ray  Psychological Evaluation(s)   Normal  Normal    Assessment:  AXIS I  Adjustment Disorder with Mixed  Emotional Features   AXIS II  No diagnosis   AXIS III  Past Medical History    Diagnosis  Date    .  Carpal tunnel syndrome       AXIS IV  problems with primary support group   AXIS V  51-60 moderate symptoms    Treatment Plan/Recommendations:  PLAN:  1. Affirm with the patient that the medications are taken as ordered. Patient expressed understanding of how their medications were to be used.  2. Continue the following psychiatric medications as written prior to this appointment with the following changes:  a) Citalopram 40 mg daily-Patient has a 90 day supply. Will consider change of medications at next visit if there is no improvement. 3. Therapy: brief supportive therapy provided. Discussed psychosocial stressors. Discussed need for individual therapy 4. Risks and benefits, side effects and alternatives discussed with patient, she was given an opportunity to ask questions about her medication, illness, and treatment. All current psychiatric medications have been reviewed and discussed with the patient and adjusted as clinically appropriate. The patient has been provided an accurate and updated list of the medications being now prescribed.  5. Patient told to call clinic if any problems occur. Patient advised to go to ER if she should develop SI/HI, side effects, or if symptoms worsen. Has crisis numbers to call if needed.  6. No labs warranted at this time.  7. The patient was encouraged to keep all PCP and specialty clinic appointments.  8.  Patient was instructed to return to clinic in 4 weeks.  9. The patient was advised to call and cancel their mental health appointment within 24 hours of the appointment, if they are unable to keep the appointment, as well as the three no show and termination from clinic policy.  10. The patient expressed understanding of the plan and agrees with the above.   Jacqulyn Cane, M.D.  01/17/2013 4:36 PM

## 2013-01-20 ENCOUNTER — Encounter (HOSPITAL_COMMUNITY): Payer: Self-pay | Admitting: Psychiatry

## 2013-02-07 ENCOUNTER — Telehealth (HOSPITAL_COMMUNITY): Payer: Self-pay

## 2013-02-07 DIAGNOSIS — F4323 Adjustment disorder with mixed anxiety and depressed mood: Secondary | ICD-10-CM

## 2013-02-07 MED ORDER — BUPROPION HCL ER (XL) 150 MG PO TB24
150.0000 mg | ORAL_TABLET | ORAL | Status: DC
Start: 2013-02-07 — End: 2013-02-16

## 2013-02-07 NOTE — Telephone Encounter (Signed)
Would like to add medication that you had suggested/discussed

## 2013-02-07 NOTE — Telephone Encounter (Signed)
Will start bupropion XL 150 mg daily.

## 2013-02-16 ENCOUNTER — Ambulatory Visit (INDEPENDENT_AMBULATORY_CARE_PROVIDER_SITE_OTHER): Payer: BC Managed Care – PPO | Admitting: Psychiatry

## 2013-02-16 ENCOUNTER — Encounter (HOSPITAL_COMMUNITY): Payer: Self-pay | Admitting: Psychiatry

## 2013-02-16 VITALS — BP 133/88 | HR 85 | Ht 65.0 in | Wt 248.0 lb

## 2013-02-16 DIAGNOSIS — F4323 Adjustment disorder with mixed anxiety and depressed mood: Secondary | ICD-10-CM

## 2013-02-16 MED ORDER — BUPROPION HCL ER (XL) 150 MG PO TB24: 150.0000 mg | ORAL_TABLET | ORAL | Status: DC

## 2013-02-16 MED ORDER — CITALOPRAM HYDROBROMIDE 40 MG PO TABS: 40.0000 mg | ORAL_TABLET | Freq: Every day | ORAL | Status: DC

## 2013-02-16 NOTE — Progress Notes (Signed)
[facility] Behavioral Health Follow-up Outpatient Visit  Julie Schaefer 17-Aug-1971  Date: 02/16/2013  Chief Complaint   Patient presents with   .  Follow-up    History of Chief Complaint:  HPI Comments: Julie Schaefer is a 42 y/o female with a past psychiatric history significant for Depression. NOS. The patient is referred for psychiatric services for medication management.   The patient reports significant improvement with the addition of bupropion to her medication regimen. She reports some improvement to her libido. She reports she continues to grieve for her father, but her depressive symptoms have improved. 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 She reports she is taking her medications and denies any side effects.   In the area of affective symptoms, patient appears mildly anxious. Patient denies current suicidal ideation, intent, or plan. Patient denies current homicidal ideation, intent, or plan. Patient denies auditory hallucinations. Patient denies visual hallucinations. Patient denies symptoms of paranoia. Patient states sleep is improved, with approximately 8 hours of sleep per night. Appetite is increase. Energy level is improving. Patient endorses some a reduction in irritability.  Patient denies symptoms of anhedonia. Patient denies hopelessness, and helplessness, but guilt.   Denies any recent episodes consistent with mania, particularly decreased need for sleep with increased energy, grandiosity, impulsivity, hyperverbal and pressured speech, or increased productivity. Denies any recent symptoms consistent with psychosis, particularly auditory or visual hallucinations, thought broadcasting/insertion/withdrawal, or ideas of reference. Also denies excessive worry to the point of physical symptoms as well as any panic attacks. Denies any history of trauma or symptoms consistent with PTSD such as flashbacks, nightmares, hypervigilance, feelings of numbness or inability to connect  with others.   Review of Systems  Constitutional: Negative for fever, chills, weight loss and malaise/fatigue.  Respiratory: Negative for cough, sputum production, shortness of breath and wheezing.   Cardiovascular: Negative for chest pain, palpitations and leg swelling.  Gastrointestinal: Negative for heartburn, nausea, vomiting, abdominal pain, diarrhea and constipation.  Neurological: Negative for dizziness, speech change, focal weakness, seizures and loss of consciousness.     Filed Vitals:   02/16/13 1617  BP: 133/88  Pulse: 85  Height: 5\' 5"  (1.651 m)  Weight: 248 lb (112.492 kg)   Physical Exam  Vitals reviewed.  Constitutional: She appears well-developed and well-nourished. No distress.  Skin: She is not diaphoretic.  Traumatic Brain Injury: No   Past Psychiatric History: Reviewed  Diagnosis: Depression   Hospitalizations: Patient denies.   Outpatient Care: Patient denies.   Substance Abuse Care: Patient denies.   Self-Mutilation:Patient denies.   Suicidal Attempts: Patient denies.   Violent Behaviors: Patient denies.    Past Medical History: Reviewed  Past Medical History  Diagnosis Date  . Carpal tunnel syndrome   . Migraine    History of Loss of Consciousness: No  Seizure History: No  Cardiac History: No   Allergies: No Known Allergies   Current Medications: Reviewed  Current Outpatient Prescriptions on File Prior to Visit  Medication Sig Dispense Refill  . buPROPion (WELLBUTRIN XL) 150 MG 24 hr tablet Take 1 tablet (150 mg total) by mouth every morning.  30 tablet  1  . cetirizine (ZYRTEC) 10 MG tablet Take 10 mg by mouth daily.      . citalopram (CELEXA) 40 MG tablet Take 1 tablet (40 mg total) by mouth daily.  90 tablet  1  . lansoprazole (PREVACID) 30 MG capsule Take 30 mg by mouth daily.      . Naproxen Sodium (  ALEVE) 220 MG CAPS Take 220 mg by mouth.      . SUMAtriptan (IMITREX) 100 MG tablet Take 100 mg by mouth Once daily as needed.       No  current facility-administered medications on file prior to visit.    Previous Psychotropic Medications: Reviewed  Medication   Celexa- 1 year    Substance Abuse History in the last 12 months: Reviewed  Caffeine: Patient reports 16 oz coffee, sweet tea 64 oz Tobacco: Patient denies.  Alcohol: Patient denies  Illicit Drugs: Patient denies.   Medical Consequences of Substance Abuse: None  Legal Consequences of Substance Abuse: None  Family Consequences of Substance Abuse: None  Blackouts: N/A  DT's: N/A  Withdrawal Symptoms: N/A   Social History: Reviewed  Current Place of Residence: [place], [place]  Place of Birth: [place], [place]  Family Members:Patient lives with her husband and 2 daughters. 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  Legal History: None  Hobbies/Interests: Knitting. Taking care of everyone in her family.   Family History: Reviewed  Family History  Problem Relation Age of Onset  . Esophageal cancer Father   . Stroke Father   . Coronary artery disease Father   . Hypertension Father   . Heart attack Father   . Depression Mother   . Carpal tunnel syndrome Daughter     Psychiatric Specialty Examination:  Objective: Appearance: Casual and Well Groomed   Eye Contact:: Good   Speech: Clear and Coherent and Normal Rate   Volume: Normal   Mood: "okay"  5/10  (0=Very depressed; 5=Neutral; 10=Very Happy)   Affect: Incongruent and Full Range   Thought Process: Coherent, Logical and Loose   Orientation: Full   Thought Content: WDL   Suicidal Thoughts: No   Homicidal Thoughts: No   Judgement: Good   Insight: Good   Psychomotor  Activity: Normal   Akathisia: No   Handed: Right   Memory: Intact immediate and recent. (donot   Assets: Communication Skills  Desire for Improvement  Financial Resources/Insurance  Housing  Intimacy  Leisure Time  Physical Health  Resilience  Social Support  Talents/Skills  Transportation  Vocational/Educational    Laboratory/X-Ray  Psychological Evaluation(s)   Normal  Normal    Assessment:  AXIS I  Adjustment Disorder with Mixed Emotional Features   AXIS II  No diagnosis   AXIS III  Past Medical History    Diagnosis  Date    .  Carpal tunnel syndrome       AXIS IV  problems with primary support group   AXIS V  51-60 moderate symptoms    Treatment Plan/Recommendations:  PLAN:  1. Affirm with the patient that the medications are taken as ordered. Patient expressed understanding of how their medications were to be used.  2. Continue the following psychiatric medications as written prior to this appointment with the following changes:  a) Citalopram 40 mg daily-Patient has a 90 day supply. Will consider change of medications at next visit if there is no improvement. B) bupropion XL 150 mg-continue this dose until next visit. 3. Therapy: brief supportive therapy provided. Discussed psychosocial stressors. Discussed need for individual therapy 4. Risks and benefits, side effects and alternatives discussed with patient,  she was given an opportunity to ask questions about her medication, illness, and treatment. All current psychiatric medications have been reviewed and discussed with the patient and adjusted as clinically appropriate. The patient has been provided an accurate and updated list of the medications being now prescribed.  5. Patient told to call clinic if any problems occur. Patient advised to go to ER if she should develop SI/HI, side effects, or if symptoms worsen. Has crisis numbers to call if needed.  6. No labs warranted at this time.  7. The patient was encouraged  to keep all PCP and specialty clinic appointments.  8. Patient was instructed to return to clinic in 4-6 weeks.  9. The patient was advised to call and cancel their mental health appointment within 24 hours of the appointment, if they are unable to keep the appointment, as well as the three no show and termination from clinic policy.  10. The patient expressed understanding of the plan and agrees with the above.   Jacqulyn Cane, M.D.  02/16/2013 4:17 PM

## 2013-04-26 ENCOUNTER — Ambulatory Visit (INDEPENDENT_AMBULATORY_CARE_PROVIDER_SITE_OTHER): Payer: BC Managed Care – PPO | Admitting: Psychiatry

## 2013-04-26 ENCOUNTER — Encounter (HOSPITAL_COMMUNITY): Payer: Self-pay | Admitting: Psychiatry

## 2013-04-26 VITALS — BP 117/85 | HR 77 | Ht 65.0 in | Wt 248.0 lb

## 2013-04-26 DIAGNOSIS — F4323 Adjustment disorder with mixed anxiety and depressed mood: Secondary | ICD-10-CM

## 2013-04-26 MED ORDER — BUPROPION HCL ER (XL) 150 MG PO TB24: 150.0000 mg | ORAL_TABLET | Freq: Two times a day (BID) | ORAL | Status: DC

## 2013-04-26 MED ORDER — CITALOPRAM HYDROBROMIDE 40 MG PO TABS: 40.0000 mg | ORAL_TABLET | Freq: Every day | ORAL | Status: DC

## 2013-04-26 NOTE — Progress Notes (Signed)
[facility] Behavioral Health Follow-up Outpatient Visit  Julie Schaefer October 11, 1971  Date: 04/26/2013  Chief Complaint   Patient presents with   .  Follow-up    History of Chief Complaint:  HPI Comments: [name] is a 42 y/o female with a past psychiatric history significant for Depression. NOS. The patient is referred for psychiatric services for medication management.   The patient reports that she has been at home.  She was supposed to go for a sleep study and didn't go yet.  She has some marital stressors.  She reports she is taking her medications and denies any side effects.   In the area of affective symptoms, patient appears mildly anxious and depressed. Patient denies current suicidal ideation, intent, or plan. Patient denies current homicidal ideation, intent, or plan. Patient denies auditory hallucinations. Patient denies visual hallucinations. Patient denies symptoms of paranoia. Patient states sleep is poor, with approximately 8.5 hours of sleep per night. Appetite is increased. Energy level is improving but has been low overall. Patient reports some irritability.  Patient reports some symptoms of anhedonia. Patient denies hopelessness, and helplessness, but guilt.   Denies any recent episodes consistent with mania, particularly decreased need for sleep with increased energy, grandiosity, impulsivity, hyperverbal and pressured speech, or increased productivity. Denies any recent symptoms consistent with psychosis, particularly auditory or visual hallucinations, thought broadcasting/insertion/withdrawal, or ideas of reference. Also denies excessive worry to the point of physical symptoms as well as any panic attacks. Denies any history of trauma or symptoms consistent with PTSD such as flashbacks, nightmares, hypervigilance, feelings of numbness or inability to connect with others.   Review of Systems  Constitutional: Negative for fever, chills, weight loss and malaise/fatigue.  Respiratory:  Negative for cough, sputum production, shortness of breath and wheezing.   Cardiovascular: Negative for chest pain, palpitations and leg swelling.  Gastrointestinal: Negative for heartburn, nausea, vomiting, abdominal pain, diarrhea and constipation.  Neurological: Negative for dizziness, speech change, focal weakness, seizures and loss of consciousness.   Filed Vitals:   04/26/13 1031  BP: 117/85  Pulse: 77  Height: 5\' 5"  (1.651 m)  Weight: 248 lb (112.492 kg)   Physical Exam  Vitals reviewed.  Constitutional: She appears well-developed and well-nourished. No distress.  Skin: She is not diaphoretic.  Musculoskeletal: Strength & Muscle Tone: within normal limits Gait & Station: normal Patient leans: N/A  Traumatic Brain Injury: No   Past Psychiatric History: Reviewed  Diagnosis: Depression   Hospitalizations: Patient denies.   Outpatient Care: Patient denies.   Substance Abuse Care: Patient denies.   Self-Mutilation:Patient denies.   Suicidal Attempts: Patient denies.   Violent Behaviors: Patient denies.    Past Medical History: Reviewed  Past Medical History  Diagnosis Date  . Carpal tunnel syndrome   . Migraine    History of Loss of Consciousness: No  Seizure History: No  Cardiac History: No   Allergies: No Known Allergies   Current Medications: Reviewed  Current Outpatient Prescriptions on File Prior to Visit  Medication Sig Dispense Refill  . buPROPion (WELLBUTRIN XL) 150 MG 24 hr tablet Take 1 tablet (150 mg total) by mouth every morning.  30 tablet  1  . cetirizine (ZYRTEC) 10 MG tablet Take 10 mg by mouth daily.      . citalopram (CELEXA) 40 MG tablet Take 1 tablet (40 mg total) by mouth daily.  90 tablet  1  . SUMAtriptan (IMITREX) 100 MG tablet Take 100 mg by mouth Once daily as needed.  No current facility-administered medications on file prior to visit.    Previous Psychotropic Medications: Reviewed  Medication   Celexa- 1 year    Substance  Abuse History in the last 12 months: Reviewed  Caffeine: Patient reports 16 oz coffee, sweet tea 64 oz Tobacco: Patient denies.  Alcohol: Patient denies  Illicit Drugs: Patient denies.   Medical Consequences of Substance Abuse: None  Legal Consequences of Substance Abuse: None  Family Consequences of Substance Abuse: None  Blackouts: N/A  DT's: N/A  Withdrawal Symptoms: N/A   Social History: Reviewed  Current Place of Residence: [place], [place]  Place of Birth: [place], [place]  Family Members:Patient lives with her husband and 2 daughters. 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  Legal History: None  Hobbies/Interests: Knitting. Taking care of everyone in her family.    Family History: Reviewed  Family History  Problem Relation Age of Onset  . Esophageal cancer Father   . Stroke Father   . Coronary artery disease Father   . Hypertension Father   . Heart attack Father   . Depression Mother   . Carpal tunnel syndrome Daughter     Psychiatric Specialty Examination:  Objective: Appearance: Casual and Well Groomed   Eye Contact:: Good   Speech: Clear and Coherent and Normal Rate   Volume: Normal   Mood: "right now pissed off"  3-4/10  (0=Very depressed; 5=Neutral; 10=Very Happy)   Affect: Incongruent and Full Range   Thought Process: Coherent, Logical and Loose   Orientation: Full   Thought Content: WDL   Suicidal Thoughts: No   Homicidal Thoughts: No   Judgement: Good   Insight: Good   Psychomotor Activity: Normal   Akathisia: No   Handed: Right   Memory: Intact immediate and recent.  Assets: Communication Skills  Desire for Improvement   Financial Resources/Insurance  Housing  Intimacy  Leisure Time  Physical Health  Resilience  Social Support  Talents/Skills  Transportation  Vocational/Educational     Laboratory/X-Ray  Psychological Evaluation(s)   Normal  Normal    Assessment:  AXIS I  Adjustment Disorder with Mixed Emotional Features   AXIS II  No diagnosis   AXIS III  Past Medical History    Diagnosis  Date    .  Carpal tunnel syndrome       AXIS IV  problems with primary support group   AXIS V  51-60 moderate symptoms    Treatment Plan/Recommendations:  PLAN:  1. Affirm with the patient that the medications are taken as ordered. Patient expressed understanding of how their medications were to be used.  2. Continue the following psychiatric medications as written prior to this appointment with the following changes:  a) Citalopram 40 mg daily-Patient has a 90 day supply.  B) bupropion XL 150 mg-will increase to 150 mg twice daily, she was advised she could take this all in the morning as tolerated. 3. Therapy: brief supportive therapy provided. Discussed psychosocial stressors. Discussed need for individual therapy 4. Risks and benefits, side effects and alternatives discussed with patient, she was given an opportunity to ask questions about her medication, illness, and treatment. All current psychiatric medications have been reviewed and discussed with the patient and adjusted as clinically  appropriate. The patient has been provided an accurate and updated list of the medications being now prescribed.  5. Patient told to call clinic if any problems occur. Patient advised to go to ER if she should develop SI/HI, side effects, or if symptoms worsen. Has crisis numbers to call if needed.  6. No labs warranted at this time.  7. The patient was encouraged to keep all PCP and specialty clinic appointments. Advised her to talk to her PCP about a sleep study. She had not gone to her last scheduled appointment. 8.  Patient was instructed to return to clinic in 3 months.  9. The patient was advised to call and cancel their mental health appointment within 24 hours of the appointment, if they are unable to keep the appointment, as well as the three no show and termination from clinic policy.  10. The patient expressed understanding of the plan and agrees with the above.   Jacqulyn Cane, M.D.  04/26/2013 10:31 AM

## 2013-06-15 ENCOUNTER — Encounter (HOSPITAL_COMMUNITY): Payer: Self-pay | Admitting: Psychiatry

## 2013-06-15 ENCOUNTER — Ambulatory Visit (INDEPENDENT_AMBULATORY_CARE_PROVIDER_SITE_OTHER): Payer: BC Managed Care – PPO | Admitting: Psychiatry

## 2013-06-15 VITALS — BP 123/88 | HR 84 | Ht 65.0 in | Wt 252.0 lb

## 2013-06-15 DIAGNOSIS — F4321 Adjustment disorder with depressed mood: Secondary | ICD-10-CM

## 2013-06-15 DIAGNOSIS — F4323 Adjustment disorder with mixed anxiety and depressed mood: Secondary | ICD-10-CM

## 2013-06-15 MED ORDER — CITALOPRAM HYDROBROMIDE 40 MG PO TABS: 40.0000 mg | ORAL_TABLET | Freq: Every day | ORAL | Status: DC

## 2013-06-15 MED ORDER — BUPROPION HCL ER (XL) 150 MG PO TB24: 150.0000 mg | ORAL_TABLET | ORAL | Status: DC

## 2013-06-15 NOTE — Progress Notes (Signed)
[facility] Behavioral Health Follow-up Outpatient Visit  Julie Schaefer 30-Nov-1970  Date: 06/15/2013  Chief Complaint   Patient presents with   .  Follow-up    History of Chief Complaint:  HPI Comments: Julie Schaefer is a 42 y/o female with a past psychiatric history significant for Depression. NOS. The patient is referred for psychiatric services for medication management.   The patient reports that she has been at home until recently as the school year has started.  She reports feeling better emotionally, with more tolerance and less irritability.    She was supposed to go for a sleep study and didn't go yet.  She has some marital stressors.  She reports she is taking her medications and denies any side effects.   In the area of affective symptoms, patient appears mildly anxious and depressed. Patient denies current suicidal ideation, intent, or plan. Patient denies current homicidal ideation, intent, or plan. Patient denies auditory hallucinations. Patient denies visual hallucinations. Patient denies symptoms of paranoia. Patient states sleep is poor, with approximately 6-7 hours of sleep per night. Appetite is good. Energy level is improving but has been low overall. Patient reports some irritability.  Patient reports some symptoms of anhedonia. Patient denies hopelessness, and helplessness, but guilt.   Denies any recent episodes consistent with mania, particularly decreased need for sleep with increased energy, grandiosity, impulsivity, hyperverbal and pressured speech, or increased productivity. Denies any recent symptoms consistent with psychosis, particularly auditory or visual hallucinations, thought broadcasting/insertion/withdrawal, or ideas of reference. Also denies excessive worry to the point of physical symptoms as well as any panic attacks. Denies any history of trauma or symptoms consistent with PTSD such as flashbacks, nightmares, hypervigilance, feelings of numbness or inability to connect  with others.   Review of Systems  Constitutional: Negative for fever, chills, weight loss and malaise/fatigue.  Respiratory: Negative for cough, sputum production, shortness of breath and wheezing.   Cardiovascular: Negative for chest pain, palpitations and leg swelling.  Gastrointestinal: Negative for heartburn, nausea, vomiting, abdominal pain, diarrhea and constipation.  Neurological: Negative for dizziness, speech change, focal weakness, seizures and loss of consciousness.   Filed Vitals:   06/15/13 1619  Pulse: 84  Height: 5\' 5"  (1.651 m)  Weight: 252 lb (114.306 kg)   Physical Exam  Vitals reviewed.  Constitutional: She appears well-developed and well-nourished. No distress.  Skin: She is not diaphoretic.  Musculoskeletal: Strength & Muscle Tone: within normal limits Gait & Station: normal Patient leans: N/A  Traumatic Brain Injury: No   Past Psychiatric History: Reviewed  Diagnosis: Depression   Hospitalizations: Patient denies.   Outpatient Care: Patient denies.   Substance Abuse Care: Patient denies.   Self-Mutilation:Patient denies.   Suicidal Attempts: Patient denies.   Violent Behaviors: Patient denies.    Past Medical History: Reviewed  Past Medical History  Diagnosis Date  . Carpal tunnel syndrome   . Migraine    History of Loss of Consciousness: No  Seizure History: No  Cardiac History: No   Allergies: No Known Allergies   Current Medications: Reviewed  Current Outpatient Prescriptions on File Prior to Visit  Medication Sig Dispense Refill  . buPROPion (WELLBUTRIN XL) 150 MG 24 hr tablet Take 1 tablet (150 mg total) by mouth 2 (two) times daily.  60 tablet  1  . cetirizine (ZYRTEC) 10 MG tablet Take 10 mg by mouth daily.      . citalopram (CELEXA) 40 MG tablet Take 1 tablet (40 mg total) by mouth daily.  90 tablet  1  . PROAIR HFA 108 (90 BASE) MCG/ACT inhaler       . SUMAtriptan (IMITREX) 100 MG tablet Take 100 mg by mouth Once daily as needed.        No current facility-administered medications on file prior to visit.    Previous Psychotropic Medications: Reviewed  Medication   Celexa- 1 year    Substance Abuse History in the last 12 months: Reviewed  Caffeine: Patient sweet tea 48z Tobacco: Patient denies.  Alcohol: Patient denies  Illicit Drugs: Patient denies.   Medical Consequences of Substance Abuse: None  Legal Consequences of Substance Abuse: None  Family Consequences of Substance Abuse: None  Blackouts: N/A  DT's: N/A  Withdrawal Symptoms: N/A   Social History: Reviewed  Current Place of Residence: [place], [place]  Place of Birth: [place], [place]  Family Members:Patient lives with her husband and 2 daughters. 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  Legal History: None  Hobbies/Interests: Knitting. Taking care of everyone in her family.    Family History: Reviewed  Family History  Problem Relation Age of Onset  . Esophageal cancer Father   . Stroke Father   . Coronary artery disease Father   . Hypertension Father   . Heart attack Father   . Depression Mother   . Carpal tunnel syndrome Daughter     Psychiatric Specialty Examination:  Objective: Appearance: Casual and Well Groomed   Eye Contact:: Good   Speech: Clear and Coherent and Normal Rate   Volume: Normal   Mood: "it's good"  8/10  (0=Very depressed; 5=Neutral; 10=Very Happy)   Affect: Incongruent and Full Range   Thought Process: Coherent, Logical and Loose   Orientation: Full   Thought Content: WDL   Suicidal Thoughts: No   Homicidal Thoughts: No   Judgement: Good   Insight: Good   Psychomotor Activity: Normal    Akathisia: No   Handed: Right   Memory: Intact immediate and recent.  Assets: Communication Skills  Desire for Improvement  Financial Resources/Insurance  Housing  Intimacy  Leisure Time  Physical Health  Resilience  Social Support  Talents/Skills  Transportation  Vocational/Educational     Laboratory/X-Ray  Psychological Evaluation(s)   Normal  Normal    Assessment:  AXIS I  Adjustment Disorder with Mixed Emotional Features   AXIS II  No diagnosis   AXIS III  Past Medical History    Diagnosis  Date    .  Carpal tunnel syndrome       AXIS IV  problems with primary support group   AXIS V  51-60 moderate symptoms    Treatment Plan/Recommendations:  PLAN:  1. Affirm with the patient that the medications are taken as ordered. Patient expressed understanding of how their medications were to be used.  2. Continue the following psychiatric medications as written prior to this appointment with the following changes:  a) Citalopram 40 mg daily-Patient has a 90 day supply. No change in dosage. B) Bupropion XL 150 mg-will  150 mg twice daily, she was advised she could take this all in the morning as tolerated. No further increase at this time. 3. Therapy: brief supportive therapy provided. Discussed psychosocial stressors. Discussed need for individual therapy 4. Risks and  benefits, side effects and alternatives discussed with patient, she was given an opportunity to ask questions about her medication, illness, and treatment. All current psychiatric medications have been reviewed and discussed with the patient and adjusted as clinically appropriate. The patient has been provided an accurate and updated list of the medications being now prescribed.  5. Patient told to call clinic if any problems occur. Patient advised to go to ER if she should develop SI/HI, side effects, or if symptoms worsen. Has crisis numbers to call if needed.  6. No labs warranted at this time.  7. The patient  was encouraged to keep all PCP and specialty clinic appointments. Advised her to talk to her PCP about a sleep study. She had not gone to her last scheduled appointment. 8. Patient was instructed to return to clinic in 2 months.  9. The patient was advised to call and cancel their mental health appointment within 24 hours of the appointment, if they are unable to keep the appointment, as well as the three no show and termination from clinic policy.  10. The patient expressed understanding of the plan and agrees with the above.   Jacqulyn Cane, M.D.  06/15/2013 4:27 PM

## 2013-12-06 ENCOUNTER — Encounter (HOSPITAL_COMMUNITY): Payer: Self-pay | Admitting: Psychiatry

## 2013-12-06 ENCOUNTER — Ambulatory Visit (INDEPENDENT_AMBULATORY_CARE_PROVIDER_SITE_OTHER): Payer: BC Managed Care – PPO | Admitting: Psychiatry

## 2013-12-06 ENCOUNTER — Encounter (INDEPENDENT_AMBULATORY_CARE_PROVIDER_SITE_OTHER): Payer: Self-pay

## 2013-12-06 VITALS — BP 117/86 | HR 78 | Wt 255.0 lb

## 2013-12-06 DIAGNOSIS — F4321 Adjustment disorder with depressed mood: Secondary | ICD-10-CM

## 2013-12-06 DIAGNOSIS — F4323 Adjustment disorder with mixed anxiety and depressed mood: Secondary | ICD-10-CM

## 2013-12-06 MED ORDER — BUPROPION HCL ER (XL) 150 MG PO TB24: 150.0000 mg | ORAL_TABLET | ORAL | Status: DC

## 2013-12-06 MED ORDER — CITALOPRAM HYDROBROMIDE 40 MG PO TABS: 40.0000 mg | ORAL_TABLET | Freq: Every day | ORAL | Status: DC

## 2013-12-06 NOTE — Progress Notes (Signed)
[facility] Behavioral Health Follow-up Outpatient Visit  Julie Schaefer 01-Jan-1971   Date: 12/06/2013  Chief Complaint   Patient presents with   .  Follow-up    History of Chief Complaint:   HPI Comments: Julie Schaefer is a 43 y/o female with a past psychiatric history significant for Depression. NOS. The patient is referred for psychiatric services for medication management.   . Location: The patient reports her depression has worsened.  . Quality: The patient reports that she has been at home until recently as the school year has started.  She reports feeling better emotionally, with more tolerance and less irritability.   She was supposed to go for a sleep study and didn't go yet.  She has some marital stressors.  She reports she is taking her medications and denies any side effects.   In the area of affective symptoms, patient appears mildly anxious and depressed. Patient denies current suicidal ideation, intent, or plan. Patient denies current homicidal ideation, intent, or plan. Patient denies auditory hallucinations. Patient denies visual hallucinations. Patient denies symptoms of paranoia. Patient states sleep is better, with approximately 7 hours of sleep per night. Appetite is good. Energy level is improving but has been low overall. Patient reports some irritability.  Patient reports some symptoms of anhedonia. Patient denies hopelessness, and helplessness, but guilt.   . Severity: Depression: 3/10 (0=Very depressed; 5=Neutral; 10=Very Happy)  Anxiety- 5/10 (0=no anxiety; 5= moderate/tolerable anxiety; 10= panic attacks)  . Duration; Depression-since teenager  . Timing- Morning and evening.  . Context: Anniversary of death.  . Modifying factors:  . Associated signs and symptoms : Denies any recent episodes consistent with mania, particularly decreased need for sleep with increased energy, grandiosity, impulsivity, hyperverbal and pressured speech, or increased productivity.  Denies any recent symptoms consistent with psychosis, particularly auditory or visual hallucinations, thought broadcasting/insertion/withdrawal, or ideas of reference. Also denies excessive worry to the point of physical symptoms as well as any panic attacks. Denies any history of trauma or symptoms consistent with PTSD such as flashbacks, nightmares, hypervigilance, feelings of numbness or inability to connect with others.   Review of Systems  Constitutional: Negative for fever, chills, weight loss and malaise/fatigue.  Respiratory: Negative for cough, sputum production, shortness of breath and wheezing.   Cardiovascular: Negative for chest pain, palpitations and leg swelling.  Gastrointestinal: Negative for heartburn, nausea, vomiting, abdominal pain, diarrhea and constipation.  Neurological: Negative for dizziness, speech change, focal weakness, seizures and loss of consciousness.   Filed Vitals:   12/06/13 1530  BP: 117/86  Pulse: 78  Weight: 255 lb (115.667 kg)   Physical Exam  Vitals reviewed.  Constitutional: She appears well-developed and well-nourished. No distress.  Skin: She is not diaphoretic.  Musculoskeletal: Strength & Muscle Tone: within normal limits Gait & Station: normal Patient leans: N/A  Traumatic Brain Injury: No   Past Psychiatric History: Reviewed  Diagnosis: Depression   Hospitalizations: Patient denies.   Outpatient Care: Patient denies.   Substance Abuse Care: Patient denies.   Self-Mutilation:Patient denies.   Suicidal Attempts: Patient denies.   Violent Behaviors: Patient denies.    Past Medical History: Reviewed  Past Medical History  Diagnosis Date  . Carpal tunnel syndrome   . Migraine    History of Loss of Consciousness: No  Seizure History: No  Cardiac History: No   Allergies: No Known Allergies   Current Medications: Reviewed  Current Outpatient Prescriptions on File Prior to Visit  Medication Sig Dispense Refill  . buPROPion  (  WELLBUTRIN XL) 150 MG 24 hr tablet Take 1 tablet (150 mg total) by mouth every morning.  30 tablet  3  . cetirizine (ZYRTEC) 10 MG tablet Take 10 mg by mouth daily.      . citalopram (CELEXA) 40 MG tablet Take 1 tablet (40 mg total) by mouth daily.  90 tablet  1  . PROAIR HFA 108 (90 BASE) MCG/ACT inhaler       . SUMAtriptan (IMITREX) 100 MG tablet Take 100 mg by mouth Once daily as needed.       No current facility-administered medications on file prior to visit.    Previous Psychotropic Medications: Reviewed  Medication   Celexa- 1 year    Substance Abuse History in the last 12 months: Reviewed  Caffeine: Patient sweet tea 48z Tobacco: Patient denies.  Alcohol: Patient denies  Illicit Drugs: Patient denies.   Medical Consequences of Substance Abuse: None  Legal Consequences of Substance Abuse: None  Family Consequences of Substance Abuse: None  Blackouts: N/A  DT's: N/A  Withdrawal Symptoms: N/A   Social History: Reviewed  [place]idence[place], [place]  Place of Birth: [place], [place]  Family Members:Patient lives with her husband and 2 daughters. 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  Legal History: None  Hobbies/Interests: Knitting. Taking care of everyone in her family.    Family History: Reviewed  Family History  Problem Relation Age of Onset  . Esophageal cancer Father   . Stroke Father   . Coronary artery disease Father   . Hypertension Father   . Heart attack Father   . Depression Mother   . Carpal tunnel syndrome Daughter     Psychiatric Specialty Examination:  Objective: Appearance: Casual and Well Groomed   Eye  Contact:: Good   Speech: Clear and Coherent and Normal Rate   Volume: Normal   Mood: "it's whatever."   Affect: Incongruent and Full Range   Thought Process: Coherent, Logical and Loose   Orientation: Full   Thought Content: WDL   Suicidal Thoughts: No   Homicidal Thoughts: No   Judgement: Good   Insight: Good   Psychomotor Activity: Normal   Akathisia: No   Handed: Right   Memory: Intact immediate; recent.  Assets: Communication Skills  Desire for Improvement  Financial Resources/Insurance  Housing  Intimacy  Leisure Time  Physical Health  Resilience  Social Support  Talents/Skills  Transportation  Vocational/Educational     Laboratory/X-Ray  Psychological Evaluation(s)   Normal  Normal    Assessment:  Adjustment Disorder with Mixed Emotional Features-stable AXIS I  Adjustment Disorder with Mixed Emotional Features   AXIS II  No diagnosis   AXIS III  Past Medical History    Diagnosis  Date    .  Carpal tunnel syndrome       AXIS IV  problems with primary support group   AXIS V  51-60 moderate symptoms    Treatment Plan/Recommendations:  PLAN:  1. Affirm with the patient that the medications are taken as ordered. Patient expressed understanding of how their medications were to be used.  2. Continue the following psychiatric medications as written prior to this appointment with the following changes:  a) Citalopram 40 mg daily-Patient  has a 90 day supply. No change in dosage. B) Bupropion XL 150 mg- 3. Therapy: brief supportive therapy provided. Discussed psychosocial stressors. Discussed need for individual therapy. More than 50% of the visit was spent on individual therapy/counseling.  4. Risks and benefits, side effects and alternatives discussed with patient, she was given an opportunity to ask questions about her medication, illness, and treatment. All current psychiatric medications have been reviewed and discussed with the patient and adjusted as clinically  appropriate. The patient has been provided an accurate and updated list of the medications being now prescribed.  5. Patient told to call clinic if any problems occur. Patient advised to go to ER if she should develop SI/HI, side effects, or if symptoms worsen. Has crisis numbers to call if needed.  6. No labs warranted at this time.  7. The patient was encouraged to keep all PCP and specialty clinic appointments. Advised her to talk to her PCP about a sleep study. She had not gone to her last scheduled appointment. 8. Patient was instructed to return to clinic in 2 months.  9. The patient was advised to call and cancel their mental health appointment within 24 hours of the appointment, if they are unable to keep the appointment, as well as the three no show and termination from clinic policy.  10. The patient expressed understanding of the plan and agrees with the above.  Time spent: 30 minutes Jacqulyn Cane, M.D.  12/06/2013 3:28 PM

## 2014-01-17 ENCOUNTER — Ambulatory Visit (HOSPITAL_COMMUNITY): Payer: Self-pay | Admitting: Psychiatry

## 2014-02-08 ENCOUNTER — Other Ambulatory Visit (HOSPITAL_COMMUNITY): Payer: Self-pay | Admitting: Psychiatry

## 2014-06-15 ENCOUNTER — Encounter (INDEPENDENT_AMBULATORY_CARE_PROVIDER_SITE_OTHER): Payer: Self-pay

## 2014-06-15 ENCOUNTER — Encounter (HOSPITAL_COMMUNITY): Payer: Self-pay | Admitting: Psychiatry

## 2014-06-15 ENCOUNTER — Ambulatory Visit (INDEPENDENT_AMBULATORY_CARE_PROVIDER_SITE_OTHER): Payer: BC Managed Care – PPO | Admitting: Psychiatry

## 2014-06-15 VITALS — Ht 65.0 in | Wt 254.0 lb

## 2014-06-15 DIAGNOSIS — F4321 Adjustment disorder with depressed mood: Secondary | ICD-10-CM

## 2014-06-15 DIAGNOSIS — F4323 Adjustment disorder with mixed anxiety and depressed mood: Secondary | ICD-10-CM

## 2014-06-15 MED ORDER — BUPROPION HCL ER (XL) 150 MG PO TB24: 150.0000 mg | ORAL_TABLET | ORAL | Status: DC

## 2014-06-15 MED ORDER — CITALOPRAM HYDROBROMIDE 40 MG PO TABS: 40.0000 mg | ORAL_TABLET | Freq: Every day | ORAL | Status: DC

## 2014-06-15 NOTE — Progress Notes (Signed)
Patient ID: Julie Schaefer, female   DOB: [birth year], 43 y.o.   MRN: [id number]   [facility] Follow-up Outpatient Visit  Julie Schaefer Mar 09, 1971   Date: 06/15/2014  Chief Complaint   Patient presents with   .  Follow-up    History of Chief Complaint:   HPI Comments: [name] is a 43 y/o female with a past psychiatric history significant for Depression. NOS. The patient is referred for psychiatric services for medication management.   . Location: The patient reports her depression is better. Depression started after the death of her dad in 2012/03/08. She is also having some concerns with her daughter was also seeing psychiatry provider.  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   . Quality: Summer break as well as a little low far because of less activities but overall longer does help her depression. She has 3 kids. Relationship is going on fine. No reported side effects on Wellbutrin and Celexa. Some decreased libido on Celexa but Wellbutrin does help.  In the area of affective symptoms, patient appears mildly anxious and depressed. Patient denies current suicidal ideation, intent, or plan. Patient denies current homicidal ideation, intent, or plan. Patient denies auditory hallucinations. Patient denies visual hallucinations. Patient denies symptoms of paranoia. Patient states sleep is better, with approximately 7 hours of sleep per night. Appetite is good. Energy level is improving but has been low overall. Patient reports some irritability.  Patient reports some symptoms of anhedonia. Patient denies hopelessness, and helplessness, but guilt.   . Severity: Depression: 6/10 (0=Very depressed; 5=Neutral; 10=Very Happy)  Anxiety- 5/10 (0=no anxiety; 5= moderate/tolerable anxiety; 10= panic attacks)  . Duration; Depression-since teenager  . Timing- Morning and evening.  . Context: Anniversary of death.  . Modifying factors:  .  Associated signs and symptoms : Denies any recent episodes consistent with mania, particularly decreased need for sleep with increased energy, grandiosity, impulsivity, hyperverbal and pressured speech, or increased productivity. Denies any recent symptoms consistent with psychosis, particularly auditory or visual hallucinations, thought broadcasting/insertion/withdrawal, or ideas of reference. Also denies excessive worry to the point of physical symptoms as well as any panic attacks. Denies any history of trauma or symptoms consistent with PTSD such as flashbacks, nightmares, hypervigilance, feelings of numbness or inability to connect with others.   Review of Systems  Constitutional: Positive for weight loss. Negative for fever.  Cardiovascular: Negative for chest pain and leg swelling.  Gastrointestinal: Negative for heartburn, nausea, vomiting, abdominal pain, diarrhea and constipation.  Neurological: Negative for dizziness, speech change, focal weakness, seizures and loss of consciousness.  Psychiatric/Behavioral: Positive for depression. Negative for suicidal ideas, hallucinations and substance abuse. The patient is not nervous/anxious and does not have insomnia.    Filed Vitals:   06/15/14 0927  Height: 5\' 5"  (1.651 m)  Weight: 254 lb (115.214 kg)   Physical Exam  Vitals reviewed.  Constitutional: She appears well-developed and well-nourished. No distress.  Skin: She is not diaphoretic.  Musculoskeletal: Strength & Muscle Tone: within normal limits Gait & Station: normal Patient leans: N/A  Traumatic Brain Injury: No   Past Psychiatric History: Reviewed  Diagnosis: Depression   Hospitalizations: Patient denies.   Outpatient Care: Patient denies.   Substance Abuse Care: Patient denies.   Self-Mutilation:Patient denies.   Suicidal Attempts: Patient denies.   Violent Behaviors: Patient denies.    Past Medical History: Reviewed  Past Medical History  Diagnosis Date  . Carpal  tunnel syndrome   .  Migraine    History of Loss of Consciousness: No  Seizure History: No  Cardiac History: No   Allergies: No Known Allergies   Current Medications: Reviewed  Current Outpatient Prescriptions on File Prior to Visit  Medication Sig Dispense Refill  . cetirizine (ZYRTEC) 10 MG tablet Take 10 mg by mouth daily.      Marland Kitchen PROAIR HFA 108 (90 BASE) MCG/ACT inhaler       . SUMAtriptan (IMITREX) 100 MG tablet Take 100 mg by mouth Once daily as needed.       No current facility-administered medications on file prior to visit.    Previous Psychotropic Medications: Reviewed  Medication   Celexa- 1 year    Substance Abuse History in the last 12 months: Reviewed  Caffeine: Patient sweet tea 48z Tobacco: Patient denies.  Alcohol: Patient denies  Illicit Drugs: Patient denies.   Medical Consequences of Substance Abuse: None  Legal Consequences of Substance Abuse: None  Family Consequences of Substance Abuse: None  Blackouts: N/A  DT's: N/A  Withdrawal Symptoms: N/A   Social History: Reviewed  Current Place of Residence: [place], [place]  Place of Birth: [place], [place]  Family Members:Patient lives with her husband and 2 daughters. 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  Legal History: None  Hobbies/Interests: Knitting. Taking care of everyone in her family.    Family History: Reviewed  Family History  Problem Relation Age of Onset  . Esophageal cancer Father   . Stroke Father   . Coronary artery disease Father   . Hypertension Father   . Heart attack Father   . Depression Mother   . Carpal tunnel syndrome Daughter     Psychiatric  Specialty Examination:  Objective: Appearance: Casual and Well Groomed   Eye Contact:: Good   Speech: Clear and Coherent and Normal Rate   Volume: Normal   Mood: euthymic  Affect: Incongruent and Full Range   Thought Process: Coherent, Logical and Loose   Orientation: Full   Thought Content: WDL   Suicidal Thoughts: No   Homicidal Thoughts: No   Judgement: Good   Insight: Good   Psychomotor Activity: Normal   Akathisia: No   Handed: Right   Memory: Intact immediate; recent.  Assets: Communication Skills  Desire for Improvement  Financial Resources/Insurance  Housing  Intimacy  Leisure Time  Physical Health  Resilience  Social Support  Talents/Skills  Transportation  Vocational/Educational     Laboratory/X-Ray  Psychological Evaluation(s)   Normal  Normal    Assessment:  Adjustment Disorder with Mixed Emotional Features-stable AXIS I  Adjustment Disorder with Mixed Emotional Features   AXIS II  No diagnosis   AXIS III  Past Medical History    Diagnosis  Date    .  Carpal tunnel syndrome       AXIS IV  problems with primary support group   AXIS V  51-60 moderate symptoms    Treatment Plan/Recommendations:  PLAN:  1. Affirm with the patient that the medications are taken as ordered. Patient expressed understanding of how their medications were to be used.  2. Continue the following psychiatric medications as written prior to this appointment with the following changes:  a) Citalopram 40 mg daily-Patient has a  90 day supply. No change in dosage. B) Bupropion XL 150 mg- no change.  3. Therapy: brief supportive therapy provided. Discussed psychosocial stressors. Discussed need for individual therapy. More than 50% of the visit was spent on individual therapy/counseling.  4. Risks and benefits, side effects and alternatives discussed with patient, she was given an opportunity to ask questions about her medication, illness, and treatment. All current psychiatric  medications have been reviewed and discussed with the patient and adjusted as clinically appropriate. The patient has been provided an accurate and updated list of the medications being now prescribed.  5. Patient told to call clinic if any problems occur. Patient advised to go to ER if she should develop SI/HI, side effects, or if symptoms worsen. Has crisis numbers to call if needed.  6. No labs warranted at this time.  7. The patient was encouraged to keep all PCP and specialty clinic appointments. Advised her to talk to her PCP about a sleep study. She had not gone to her last scheduled appointment. 8. Patient was instructed to return to clinic in 2 months.  9. The patient was advised to call and cancel their mental health appointment within 24 hours of the appointment, if they are unable to keep the appointment, as well as the three no show and termination from clinic policy.  10. The patient expressed understanding of the plan and agrees with the above.  Time spent: 25 minutes Thresa RossNADEEM Lilyth Lawyer, M.D.  06/15/2014 9:31 AM

## 2014-07-31 ENCOUNTER — Other Ambulatory Visit: Payer: Self-pay | Admitting: Family Medicine

## 2014-07-31 DIAGNOSIS — IMO0002 Reserved for concepts with insufficient information to code with codable children: Secondary | ICD-10-CM

## 2014-07-31 DIAGNOSIS — R229 Localized swelling, mass and lump, unspecified: Principal | ICD-10-CM

## 2014-08-01 ENCOUNTER — Other Ambulatory Visit: Payer: Self-pay | Admitting: Family Medicine

## 2014-08-01 ENCOUNTER — Ambulatory Visit (INDEPENDENT_AMBULATORY_CARE_PROVIDER_SITE_OTHER): Payer: BC Managed Care – PPO

## 2014-08-01 DIAGNOSIS — R1901 Right upper quadrant abdominal swelling, mass and lump: Secondary | ICD-10-CM

## 2014-08-01 DIAGNOSIS — Z1231 Encounter for screening mammogram for malignant neoplasm of breast: Secondary | ICD-10-CM

## 2014-08-01 DIAGNOSIS — R229 Localized swelling, mass and lump, unspecified: Principal | ICD-10-CM

## 2014-08-01 DIAGNOSIS — IMO0002 Reserved for concepts with insufficient information to code with codable children: Secondary | ICD-10-CM

## 2014-09-13 ENCOUNTER — Ambulatory Visit (INDEPENDENT_AMBULATORY_CARE_PROVIDER_SITE_OTHER): Payer: BC Managed Care – PPO | Admitting: Psychiatry

## 2014-09-13 ENCOUNTER — Encounter (HOSPITAL_COMMUNITY): Payer: Self-pay | Admitting: Psychiatry

## 2014-09-13 VITALS — BP 108/90 | HR 79 | Ht 66.0 in | Wt 260.0 lb

## 2014-09-13 DIAGNOSIS — F4323 Adjustment disorder with mixed anxiety and depressed mood: Secondary | ICD-10-CM

## 2014-09-13 DIAGNOSIS — F4321 Adjustment disorder with depressed mood: Secondary | ICD-10-CM

## 2014-09-13 MED ORDER — CITALOPRAM HYDROBROMIDE 40 MG PO TABS: 40.0000 mg | ORAL_TABLET | Freq: Every day | ORAL | Status: DC

## 2014-09-13 NOTE — Progress Notes (Signed)
Patient ID: Julie Schaefer, female   DOB: 03/24/1971, 43 y.o.   MRN: [id number]   [facility]  Julie LownHelen Ritsema 08/04/1971   Date: 06/15/2014  Chief Complaint   Patient presents with   .  Follow-up    History of Chief Complaint:   HPI Comments: Ms. Ranell PatrickSigler is a 43 y/o female with a past psychiatric history significant for Depression. NOS. The patient is referred for psychiatric services for medication management.   . Location: The patient reports her depression is better. Depression started after the death of her dad in 2013. She is also having some concerns with her daughter was also seeing psychiatry provider.  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   . Quality: She did not continue wellbutrin says doing reasoable on celexa but somewhat concern about her Dad'd death anniversary coming December.   In the area of affective symptoms, patient appears mildly anxious and depressed. Patient denies current suicidal ideation, intent, or plan. Patient denies current homicidal ideation, intent, or plan. Patient denies auditory hallucinations. Patient denies visual hallucinations. Patient denies symptoms of paranoia. Patient states sleep is better, with approximately 7 hours of sleep per night. Appetite is good. Energy level is improving but has been low overall. Patient reports some irritability.  Patient reports some symptoms of anhedonia. Patient denies hopelessness, and helplessness, but guilt.   . Severity: Depression: 6/10 (0=Very depressed; 5=Neutral; 10=Very Happy)  Anxiety- 5/10 (0=no anxiety; 5= moderate/tolerable anxiety; 10= panic attacks)  . Duration; Depression-since teenager  . Timing- Morning and evening.  . Context: Anniversary of death.  . Modifying factors:    Review of Systems  Constitutional: Negative for fever.  Cardiovascular: Negative for chest pain and leg swelling.  Gastrointestinal: Negative for nausea.   Neurological: Negative for dizziness and tremors.  Psychiatric/Behavioral: Positive for depression. Negative for suicidal ideas, hallucinations and substance abuse. The patient is not nervous/anxious and does not have insomnia.    Filed Vitals:   09/13/14 1606  BP: 108/90  Pulse: 79  Height: 5\' 6"  (1.676 m)  Weight: 260 lb (117.935 kg)   Physical Exam  Vitals reviewed.  Constitutional: She appears well-developed and well-nourished. No distress.  Skin: She is not diaphoretic.  Musculoskeletal: Strength & Muscle Tone: within normal limits Gait & Station: normal Patient leans: N/A  Traumatic Brain Injury: No   Past Psychiatric History: Reviewed  Diagnosis: Depression   Hospitalizations: Patient denies.   Outpatient Care: Patient denies.   Substance Abuse Care: Patient denies.   Self-Mutilation:Patient denies.   Suicidal Attempts: Patient denies.   Violent Behaviors: Patient denies.    Past Medical History: Reviewed  Past Medical History  Diagnosis Date  . Carpal tunnel syndrome   . Migraine    History of Loss of Consciousness: No  Seizure History: No  Cardiac History: No   Allergies: No Known Allergies   Current Medications: Reviewed  Current Outpatient Prescriptions on File Prior to Visit  Medication Sig Dispense Refill  . buPROPion (WELLBUTRIN XL) 150 MG 24 hr tablet Take 1 tablet (150 mg total) by mouth every morning. 30 tablet 1  . cetirizine (ZYRTEC) 10 MG tablet Take 10 mg by mouth daily.    Marland Kitchen. PROAIR HFA 108 (90 BASE) MCG/ACT inhaler     . SUMAtriptan (IMITREX) 100 MG tablet Take 100 mg by mouth Once daily as needed.     No current facility-administered medications on file prior to visit.    Previous Psychotropic Medications:  Reviewed  Medication   Celexa- 1 year    Substance Abuse History in the last 12 months: Reviewed  Caffeine: Patient sweet tea 48z Tobacco: Patient denies.  Alcohol: Patient denies  Illicit Drugs: Patient denies.      Family  History: Reviewed  Family History  Problem Relation Age of Onset  . Esophageal cancer Father   . Stroke Father   . Coronary artery disease Father   . Hypertension Father   . Heart attack Father   . Depression Mother   . Carpal tunnel syndrome Daughter     Psychiatric Specialty Examination:  Objective: Appearance: Casual and Well Groomed   Eye Contact:: Good   Speech: Clear and Coherent and Normal Rate   Volume: Normal   Mood: euthymic  Affect: Incongruent and Full Range   Thought Process: Coherent, Logical and Loose   Orientation: Full   Thought Content: WDL   Suicidal Thoughts: No   Homicidal Thoughts: No   Judgement: Good   Insight: Good   Psychomotor Activity: Normal   Akathisia: No   Handed: Right   Memory: Intact immediate; recent.  Assets: Communication Skills  Desire for Improvement  Financial Resources/Insurance  Housing  Intimacy  Leisure Time  Physical Health  Resilience  Social Support  Talents/Skills  Transportation  Vocational/Educational     Laboratory/X-Ray  Psychological Evaluation(s)   Normal  Normal    Assessment:  Adjustment Disorder with Mixed Emotional Features-stable AXIS I  Adjustment Disorder with Mixed Emotional Features   AXIS II  No diagnosis   AXIS III  Past Medical History    Diagnosis  Date    .  Carpal tunnel syndrome       AXIS IV  problems with primary support group   AXIS V  51-60 moderate symptoms    Treatment Plan/Recommendations:  PLAN:  1. Affirm with the patient that the medications are taken as ordered. Patient expressed understanding of how their medications were to be used.  2. Continue the following psychiatric medications as written prior to this appointment with the following changes:  Continue celexa 40mg .  She will get wellbutrin and start that considering her depression concern over the winter and anniversary.  Reviewed side effects. None as of now.  3. Therapy: brief supportive therapy provided. Discussed  psychosocial stressors. Discussed need for individual therapy. More than 50% of the visit was spent on individual therapy/counseling.  4. Risks and benefits, side effects and alternatives discussed with patient, she was given an opportunity to ask questions about her medication, illness, and treatment. All current psychiatric medications have been reviewed and discussed with the patient and adjusted as clinically appropriate. The patient has been provided an accurate and updated list of the medications being now prescribed.  5. Patient told to call clinic if any problems occur. Patient advised to go to ER if she should develop SI/HI, side effects, or if symptoms worsen. Has crisis numbers to call if needed.  6. No labs warranted at this time.  7. The patient was encouraged to keep all PCP and specialty clinic appointments. Advised her to talk to her PCP about a sleep study. She had not gone to her last scheduled appointment. 8. Patient was instructed to return to clinic in 2 months.  9. The patient was advised to call and cancel their mental health appointment within 24 hours of the appointment, if they are unable to keep the appointment, as well as the three no show and termination from clinic policy.  10. The patient expressed understanding of the plan and agrees with the above.  Time spent: 25 minutes Thresa RossNADEEM Martel Galvan, M.D.  09/13/2014 4:11 PM

## 2014-09-19 ENCOUNTER — Ambulatory Visit (INDEPENDENT_AMBULATORY_CARE_PROVIDER_SITE_OTHER): Payer: BC Managed Care – PPO

## 2014-09-19 DIAGNOSIS — Z1231 Encounter for screening mammogram for malignant neoplasm of breast: Secondary | ICD-10-CM

## 2014-11-07 ENCOUNTER — Encounter: Payer: Self-pay | Admitting: *Deleted

## 2014-11-07 ENCOUNTER — Emergency Department (INDEPENDENT_AMBULATORY_CARE_PROVIDER_SITE_OTHER): Payer: BC Managed Care – PPO

## 2014-11-07 ENCOUNTER — Emergency Department
Admission: EM | Admit: 2014-11-07 | Discharge: 2014-11-07 | Disposition: A | Discharge status: Home / Self Care | Payer: BC Managed Care – PPO | Source: Home / Self Care | Attending: Family Medicine | Admitting: Family Medicine

## 2014-11-07 DIAGNOSIS — M546 Pain in thoracic spine: Secondary | ICD-10-CM

## 2014-11-07 DIAGNOSIS — M549 Dorsalgia, unspecified: Secondary | ICD-10-CM

## 2014-11-07 DIAGNOSIS — R52 Pain, unspecified: Secondary | ICD-10-CM

## 2014-11-07 DIAGNOSIS — R0789 Other chest pain: Secondary | ICD-10-CM

## 2014-11-07 HISTORY — DX: Depression, unspecified: F32.A

## 2014-11-07 HISTORY — DX: Major depressive disorder, single episode, unspecified: F32.9

## 2014-11-07 HISTORY — DX: Other seasonal allergic rhinitis: J30.2

## 2014-11-07 NOTE — Discharge Instructions (Signed)
Take Ibuprofen 200mg , 4 tabs every 8 hours with food until pain improves.

## 2014-11-07 NOTE — ED Provider Notes (Signed)
CSN: [id number]     Arrival date & time 11/07/14  1937 History   First MD Initiated Contact with Patient 11/07/14 2006     Chief Complaint  Patient presents with  . Back Pain      HPI Comments: About two hours prior to her visit here, patient suddenly felt a pain in her left upper back at the shoulder blade.  She recalls no injury.  The pain does not radiate and she denies fever or chest pain.  She recalls having had a URI several weeks ago.  The pain is worse with deep inspiration, and improved after taking Excedrin Migraine.  Patient is a 44 y.o. female presenting with back pain. The history is provided by the patient and the spouse.  Back Pain Pain location: left upper back. Quality:  Stabbing Radiates to:  Does not radiate Pain severity:  Mild Onset quality:  Sudden Duration:  2 hours Timing:  Constant Progression:  Unchanged Chronicity:  New Relieved by:  OTC medications Worsened by:  Coughing and deep breathing Ineffective treatments:  None tried Associated symptoms: no abdominal pain, no chest pain, no fever, no headaches, no leg pain, no numbness, no paresthesias and no weakness     Past Medical History  Diagnosis Date  . Carpal tunnel syndrome   . Migraine   . Sleep apnea   . Depression   . Seasonal allergies    Past Surgical History  Procedure Laterality Date  . Hernia repair  1993  . Carpal tunnel release  2009   Family History  Problem Relation Age of Onset  . Esophageal cancer Father   . Stroke Father   . Coronary artery disease Father   . Hypertension Father   . Heart attack Father   . Depression Mother   . Carpal tunnel syndrome Daughter    History  Substance Use Topics  . Smoking status: Never Smoker   . Smokeless tobacco: Not on file  . Alcohol Use: Yes     Comment: Rare   OB History    No data available     Review of Systems  Constitutional: Negative for fever.  Cardiovascular: Negative for chest pain.  Gastrointestinal: Negative for  abdominal pain.  Musculoskeletal: Positive for back pain.  Neurological: Negative for weakness, numbness, headaches and paresthesias.  All other systems reviewed and are negative.   Allergies  Review of patient's allergies indicates no known allergies.  Home Medications   Prior to Admission medications   Medication Sig Start Date End Date Taking? Authorizing Provider  cetirizine (ZYRTEC) 10 MG tablet Take 10 mg by mouth daily.   Yes Historical Provider, MD  citalopram (CELEXA) 40 MG tablet Take 1 tablet (40 mg total) by mouth daily. 09/13/14  Yes Thresa Ross, MD  buPROPion (WELLBUTRIN XL) 150 MG 24 hr tablet Take 1 tablet (150 mg total) by mouth every morning. 06/15/14 06/15/15  Thresa Ross, MD  PROAIR HFA 108 (90 BASE) MCG/ACT inhaler  04/07/13   Historical Provider, MD  SUMAtriptan (IMITREX) 100 MG tablet Take 100 mg by mouth Once daily as needed. 08/12/12   Historical Provider, MD   BP 151/83 mmHg  Pulse 78  Temp(Src) 97.9 F (36.6 C) (Oral)  Resp 18  Ht 5' 5.5" (1.664 m)  Wt 262 lb (118.842 kg)  BMI 42.92 kg/m2  SpO2 97%  LMP 10/23/2014 Physical Exam  Constitutional: She is oriented to person, place, and time. She appears well-developed and well-nourished. No distress.  Patient is obese (BMI  42.9)  HENT:  Head: Normocephalic.  Mouth/Throat: Oropharynx is clear and moist.  Eyes: Conjunctivae are normal. Pupils are equal, round, and reactive to light.  Neck: Normal range of motion. Neck supple.  Cardiovascular: Normal heart sounds.   Pulmonary/Chest: Breath sounds normal.    There is vague tenderness along the left upper thoracic paraspinous muscles as noted on diagram.    Abdominal: There is no tenderness.  Musculoskeletal: She exhibits no edema.  Lymphadenopathy:    She has no cervical adenopathy.  Neurological: She is alert and oriented to person, place, and time.  Skin: Skin is warm and dry. No rash noted.  Nursing note and vitals reviewed.   ED Course    Procedures  none    Imaging Review Dg Chest 2 View  11/07/2014   CLINICAL DATA:  Upper mid chest pain for a few hours  EXAM: CHEST  2 VIEW  COMPARISON:  None  FINDINGS: Upper normal heart size.  Normal mediastinal contours and pulmonary vascularity.  Lungs clear.  No pleural effusion or pneumothorax.  Bones unremarkable.  IMPRESSION: No acute abnormalities.   Electronically Signed   By: Ulyses SouthwardMark  Boles M.D.   On: 11/07/2014 20:09     MDM   1. Upper back pain on left side; ?pleurisy (had recent URI).  ?early herpes zoster   2. Pain     Take Ibuprofen 200mg , 4 tabs every 8 hours with food until pain improves. Followup with Family Doctor if not improved in one week.     Lattie HawStephen A Emeterio Balke, MD 11/09/14 1739

## 2014-11-07 NOTE — ED Notes (Signed)
Pt c/o LT side back pain at her shoulder blade x 2 hours ago. Hurts worse with deep breath. Denies fever, recent URI, or chest pain.

## 2014-11-13 ENCOUNTER — Telehealth: Payer: Self-pay | Admitting: *Deleted

## 2014-12-13 ENCOUNTER — Encounter (HOSPITAL_COMMUNITY): Payer: Self-pay | Admitting: Psychiatry

## 2014-12-13 ENCOUNTER — Encounter (INDEPENDENT_AMBULATORY_CARE_PROVIDER_SITE_OTHER): Payer: Self-pay

## 2014-12-13 ENCOUNTER — Ambulatory Visit (INDEPENDENT_AMBULATORY_CARE_PROVIDER_SITE_OTHER): Payer: BC Managed Care – PPO | Admitting: Psychiatry

## 2014-12-13 VITALS — Ht 66.0 in | Wt 255.0 lb

## 2014-12-13 DIAGNOSIS — F4321 Adjustment disorder with depressed mood: Secondary | ICD-10-CM

## 2014-12-13 DIAGNOSIS — F4323 Adjustment disorder with mixed anxiety and depressed mood: Secondary | ICD-10-CM | POA: Diagnosis not present

## 2014-12-13 MED ORDER — BUPROPION HCL ER (XL) 150 MG PO TB24: 150.0000 mg | ORAL_TABLET | ORAL | Status: DC

## 2014-12-13 MED ORDER — CITALOPRAM HYDROBROMIDE 40 MG PO TABS: 40.0000 mg | ORAL_TABLET | Freq: Every day | ORAL | Status: AC

## 2014-12-13 NOTE — Progress Notes (Signed)
Patient ID: Boris LownHelen Gasparini, female   DOB: 08/16/1971, 44 y.o.   MRN: [id number]   [facility]  Boris LownHelen [place] 09/17/1971   Date: 12/14/2014  Chief Complaint   Patient presents with   .  Follow-up    History of Chief Complaint:   HPI Comments: Ms. Ranell PatrickSigler is a 44 y/o female with a past psychiatric history significant for Depression. NOS. Adjustment disorder with depressed mood. The patient is referred for psychiatric services for medication management.   . Location: The patient reports her depression is better. Depression started after the death of her dad in 2013. She is also having some concerns with her daughter was also seeing psychiatry provider.   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 Says music helps. She went thru death Cassandria Angeranniversay and continues to deal with it. Her main concern is her relationship with husband of 19 years is conflicting and may not last long. That worries her and she plan to get involved in therapy.  Also havent been taking her wellbutrin regluarly.    . Quality:  In the area of affective symptoms, patient appears mildly anxious and depressed. Patient denies current suicidal ideation, intent, or plan. Patient denies current homicidal ideation, intent, or plan. Patient denies auditory hallucinations. Patient denies visual hallucinations. Patient denies symptoms of paranoia. Patient states sleep is better, with approximately 7 hours of sleep per night. Appetite is good. Energy level is improving but has been low overall. Patient reports some irritability.  Patient reports some symptoms of anhedonia. Patient denies hopelessness, and helplessness, but guilt.   . Severity: Depression: 6/10 (0=Very depressed; 5=Neutral; 10=Very Happy)  Anxiety- 5/10 (0=no anxiety; 5= moderate/tolerable anxiety; 10= panic attacks)  . Duration; Depression-since teenager  . Timing- Morning and evening.  . Context: Anniversary of  death.  . Modifying factors:    Review of Systems  Constitutional: Negative.   Cardiovascular: Negative for chest pain.  Gastrointestinal: Negative for nausea.  Neurological: Negative for tremors.  Psychiatric/Behavioral: Positive for depression. Negative for suicidal ideas.   Filed Vitals:   12/13/14 1530  Height: 5\' 6"  (1.676 m)  Weight: 255 lb (115.667 kg)   Physical Exam  Vitals reviewed.  Constitutional: She appears well-developed and well-nourished. No distress.  Skin: She is not diaphoretic.  Musculoskeletal: Strength & Muscle Tone: within normal limits Gait & Station: normal Patient leans: N/A  Traumatic Brain Injury: No   Past Psychiatric History: Reviewed  Diagnosis: Depression   Hospitalizations: Patient denies.   Outpatient Care: Patient denies.   Substance Abuse Care: Patient denies.   Self-Mutilation:Patient denies.   Suicidal Attempts: Patient denies.   Violent Behaviors: Patient denies.    Past Medical History: Reviewed  Past Medical History  Diagnosis Date  . Carpal tunnel syndrome   . Migraine    History of Loss of Consciousness: No  Seizure History: No  Cardiac History: No   Allergies: No Known Allergies   Current Medications: Reviewed  Current Outpatient Prescriptions on File Prior to Visit  Medication Sig Dispense Refill  . cetirizine (ZYRTEC) 10 MG tablet Take 10 mg by mouth daily.    Marland Kitchen. PROAIR HFA 108 (90 BASE) MCG/ACT inhaler     . SUMAtriptan (IMITREX) 100 MG tablet Take 100 mg by mouth Once daily as needed.     No current facility-administered medications on file prior to visit.    Previous Psychotropic Medications: Reviewed  Medication   Celexa- 1 year    Substance Abuse  History in the last 12 months: Reviewed  Caffeine: Patient sweet tea 48z Tobacco: Patient denies.  Alcohol: Patient denies  Illicit Drugs: Patient denies.      Family History: Reviewed  Family History  Problem Relation Age of Onset  . Esophageal  cancer Father   . Stroke Father   . Coronary artery disease Father   . Hypertension Father   . Heart attack Father   . Depression Mother   . Carpal tunnel syndrome Daughter     Psychiatric Specialty Examination:  Objective: Appearance: Casual and Well Groomed   Eye Contact:: Good   Speech: Clear and Coherent and Normal Rate   Volume: Normal   Mood: dysphoric  Affect: Incongruent and Full Range   Thought Process: Coherent, Logical and Loose   Orientation: Full   Thought Content: WDL   Suicidal Thoughts: No   Homicidal Thoughts: No   Judgement: Good   Insight: Good   Psychomotor Activity: Normal   Akathisia: No   Handed: Right   Memory: Intact immediate; recent.  Assets: Communication Skills  Desire for Improvement  Financial Resources/Insurance  Housing  Intimacy  Leisure Time  Physical Health  Resilience  Social Support  Talents/Skills  Transportation  Vocational/Educational     Laboratory/X-Ray  Psychological Evaluation(s)   Normal  Normal    Assessment:  Adjustment Disorder with Mixed Emotional Features-stable AXIS I  Adjustment Disorder with Mixed Emotional Features   AXIS II  No diagnosis   AXIS III  Past Medical History    Diagnosis  Date    .  Carpal tunnel syndrome       AXIS IV  problems with primary support group   AXIS V  51-60 moderate symptoms    Treatment Plan/Recommendations:  PLAN:  1. Affirm with the patient that the medications are taken as ordered. Patient expressed understanding of how their medications were to be used.  2. Continue the following psychiatric medications as written prior to this appointment with the following changes:  Continue celexa .  Compliance with wellbutrin she hasnt been taking it recently  Reviewed side effects. None as of now.  3. Therapy: brief supportive therapy provided. Discussed psychosocial stressors including relationship conflicts.  Discussed need for individual therapy. More than 50% of the visit  was spent on individual therapy/counseling.  4. Risks and benefits, side effects and alternatives discussed with patient, she was given an opportunity to ask questions about her medication, illness, and treatment. All current psychiatric medications have been reviewed and discussed with the patient and adjusted as clinically appropriate. The patient has been provided an accurate and updated list of the medications being now prescribed.  5. Patient told to call clinic if any problems occur. Patient advised to go to ER if she should develop SI/HI, side effects, or if symptoms worsen. Has crisis numbers to call if needed.  6. No labs warranted at this time.  7. The patient was encouraged to keep all PCP and specialty clinic appointments. Advised her to talk to her PCP about a sleep study. She had not gone to her last scheduled appointment. 8. Patient was instructed to return to clinic in 2 months.  9. The patient was advised to call and cancel their mental health appointment within 24 hours of the appointment, if they are unable to keep the appointment, as well as the three no show and termination from clinic policy.  10. The patient expressed understanding of the plan and agrees with the above.   Francesa Eugenio  Gilmore Laroche, M.D.  12/13/2014 4:02 PM

## 2015-02-14 ENCOUNTER — Ambulatory Visit (INDEPENDENT_AMBULATORY_CARE_PROVIDER_SITE_OTHER): Payer: BC Managed Care – PPO | Admitting: Psychiatry

## 2015-02-14 VITALS — HR 80 | Ht 66.0 in

## 2015-02-14 DIAGNOSIS — F4323 Adjustment disorder with mixed anxiety and depressed mood: Secondary | ICD-10-CM

## 2015-02-14 DIAGNOSIS — F4321 Adjustment disorder with depressed mood: Secondary | ICD-10-CM

## 2015-02-14 MED ORDER — BUPROPION HCL ER (XL) 150 MG PO TB24: 150.0000 mg | ORAL_TABLET | ORAL | Status: DC

## 2015-02-14 NOTE — Progress Notes (Signed)
Patient ID: Julie LownHelen Schaefer, female   DOB: 07/02/1971, 44 y.o.   MRN: [id number]   [facility]  Julie LownHelen Schaefer 03/10/1971   Date: 12/14/2014  Chief Complaint   Patient presents with   .  Follow-up    History of Chief Complaint:   HPI Comments: Julie Schaefer is a 44 y/o female with a past psychiatric history significant for Depression. NOS. Adjustment disorder with depressed mood. The patient is referred for psychiatric services for medication management.   . Location: The patient reports her depression is better. Depression started after the death of her dad in 2013. She is also having some concerns with her daughter was also seeing psychiatry provider.   She was having relationship issues exacerbated now they're back together. They've been married for 19 years now they're going through therapy and that is helping she is also doing individual therapy that is helping. Celexa has been helpful for her depression  . Quality:  In the area of affective symptoms, patient appears mildly  depressed. Patient denies current suicidal ideation, intent, or plan. Patient denies current homicidal ideation, intent, or plan. Patient denies auditory hallucinations. Patient denies visual hallucinations. Patient denies symptoms of paranoia. Patient states sleep is better, with approximately 7 hours of sleep per night. Appetite is good. Energy level is improving but has been low overall. Patient reports some irritability.  Patient reports some symptoms of anhedonia. Patient denies hopelessness, and helplessness, but guilt.   . Severity: Depression: 7/10 (0=Very depressed; 5=Neutral; 10=Very Happy)  Anxiety- 5/10 (0=no anxiety; 5= moderate/tolerable anxiety; 10= panic attacks)  . Duration; Depression-since teenager  . Timing- Morning and evening.  . Context: Anniversary of death.  . Modifying factors:    Review of Systems  Constitutional: Negative.    Psychiatric/Behavioral: Positive for depression. Negative for suicidal ideas and substance abuse.   Filed Vitals:   02/14/15 1547  Pulse: 80  Height: 5\' 6"  (1.676 m)  weight 258 lbs  Physical Exam  Vitals reviewed.  Constitutional: She appears well-developed and well-nourished. No distress.  Skin: She is not diaphoretic.  Musculoskeletal: Strength & Muscle Tone: within normal limits Gait & Station: normal Patient leans: N/A    Past Medical History: Reviewed  Past Medical History  Diagnosis Date  . Carpal tunnel syndrome   . Migraine    History of Loss of Consciousness: No  Seizure History: No  Cardiac History: No   Allergies: No Known Allergies   Current Medications: Reviewed  Current Outpatient Prescriptions on File Prior to Visit  Medication Sig Dispense Refill  . cetirizine (ZYRTEC) 10 MG tablet Take 10 mg by mouth daily.    . citalopram (CELEXA) 40 MG tablet Take 1 tablet (40 mg total) by mouth daily. 90 tablet 1  . PROAIR HFA 108 (90 BASE) MCG/ACT inhaler     . SUMAtriptan (IMITREX) 100 MG tablet Take 100 mg by mouth Once daily as needed.     No current facility-administered medications on file prior to visit.    Substance Abuse History in the last 12 months: Reviewed  Caffeine: Patient sweet tea 48z Tobacco: Patient denies.  Alcohol: Patient denies  Illicit Drugs: Patient denies.      Family History: Reviewed  Family History  Problem Relation Age of Onset  . Esophageal cancer Father   . Stroke Father   . Coronary artery disease Father   . Hypertension Father   . Heart attack Father   . Depression Mother   . Carpal tunnel  syndrome Daughter     Psychiatric Specialty Examination:  Objective: Appearance: Casual and Well Groomed   Eye Contact:: Good   Speech: Clear and Coherent and Normal Rate   Volume: Normal   Mood: dysphoric  Affect: Incongruent and Full Range   Thought Process: Coherent, Logical and Loose   Orientation: Full   Thought  Content: WDL   Suicidal Thoughts: No   Homicidal Thoughts: No   Judgement: Good   Insight: Good   Psychomotor Activity: Normal   Akathisia: No   Handed: Right   Memory: Intact immediate; recent.  Assets: Communication Skills  Desire for Improvement  Financial Resources/Insurance  Housing  Intimacy  Leisure Time  Physical Health  Resilience  Social Support  Talents/Skills  Transportation  Vocational/Educational     Laboratory/X-Ray  Psychological Evaluation(s)   Normal  Normal    Assessment:  Adjustment Disorder with Mixed Emotional Features-stable AXIS I  Adjustment Disorder with Mixed Emotional Features   AXIS II  No diagnosis   AXIS III  Past Medical History    Diagnosis  Date    .  Carpal tunnel syndrome       AXIS IV  problems with primary support group   AXIS V  51-60 moderate symptoms    Treatment Plan/Recommendations:  PLAN:  1. Affirm with the patient that the medications are taken as ordered. Patient expressed understanding of how their medications were to be used.  2. Continue the following psychiatric medications as written prior to this appointment with the following changes:  Continue celexa .  Compliance with wellbutrin she hasnt been taking it recently  Reviewed side effects. None as of now.  3. Therapy has been helpful which she will continue. Discussed need for individual therapy. More than 50% of the visit was spent on individual therapy/counseling.  4. Risks and benefits, side effects and alternatives discussed with patient, she was given an opportunity to ask questions about her medication, illness, and treatment. All current psychiatric medications have been reviewed and discussed with the patient and adjusted as clinically appropriate. The patient has been provided an accurate and updated list of the medications being now prescribed.  5. Patient told to call clinic if any problems occur. Patient advised to go to ER if she should develop SI/HI,  side effects, or if symptoms worsen. Has crisis numbers to call if needed.  6. No labs warranted at this time.  7. The patient was encouraged to keep all PCP and specialty clinic appointments. Advised her to talk to her PCP about a sleep study. She had not gone to her last scheduled appointment. 8. Patient was instructed to return to clinic in 2 months.  9. The patient was advised to call and cancel their mental health appointment within 24 hours of the appointment, if they are unable to keep the appointment, as well as the three no show and termination from clinic policy.  10. The patient expressed understanding of the plan and agrees with the above.   Time spent : 25 minutes Thresa Ross, M.D.  02/14/2015 4:11 PM

## 2015-04-12 ENCOUNTER — Ambulatory Visit (HOSPITAL_COMMUNITY): Payer: Self-pay | Admitting: Psychiatry

## 2015-04-15 ENCOUNTER — Ambulatory Visit (HOSPITAL_COMMUNITY): Payer: Self-pay | Admitting: Psychiatry

## 2015-05-08 ENCOUNTER — Ambulatory Visit (HOSPITAL_COMMUNITY): Payer: Self-pay | Admitting: Psychiatry

## 2015-07-27 ENCOUNTER — Other Ambulatory Visit (HOSPITAL_COMMUNITY): Payer: Self-pay | Admitting: Psychiatry

## 2015-07-29 NOTE — Telephone Encounter (Signed)
Received fax from [facility] for a medication refill for Celexa . Per Dr.Akhtar, medication is denied. Pt need will to schedule an appointment with the office. Pt was last seen on 02/14/15.

## 2016-04-06 ENCOUNTER — Other Ambulatory Visit: Payer: Self-pay | Admitting: Family Medicine

## 2016-04-06 ENCOUNTER — Ambulatory Visit (INDEPENDENT_AMBULATORY_CARE_PROVIDER_SITE_OTHER): Payer: BC Managed Care – PPO

## 2016-04-06 DIAGNOSIS — S92354A Nondisplaced fracture of fifth metatarsal bone, right foot, initial encounter for closed fracture: Secondary | ICD-10-CM | POA: Diagnosis not present

## 2016-04-06 DIAGNOSIS — X501XXA Overexertion from prolonged static or awkward postures, initial encounter: Secondary | ICD-10-CM

## 2016-04-06 DIAGNOSIS — R52 Pain, unspecified: Secondary | ICD-10-CM

## 2016-04-07 ENCOUNTER — Ambulatory Visit (INDEPENDENT_AMBULATORY_CARE_PROVIDER_SITE_OTHER): Payer: BC Managed Care – PPO | Admitting: Sports Medicine

## 2016-04-07 DIAGNOSIS — S92351A Displaced fracture of fifth metatarsal bone, right foot, initial encounter for closed fracture: Secondary | ICD-10-CM

## 2016-04-07 MED ORDER — HYDROCODONE-ACETAMINOPHEN 5-325 MG PO TABS: 1.0000 | ORAL_TABLET | Freq: Three times a day (TID) | ORAL | Status: DC | PRN

## 2016-04-07 MED ORDER — AMBULATORY NON FORMULARY MEDICATION: Status: AC

## 2016-04-07 NOTE — Assessment & Plan Note (Addendum)
Cast placed, fracture is somewhat proximal to the Jones territory. Pain medication, rolling knee scooter, we expect approximate 6-8 weeks of nonweightbearing in a cast for healing.  I billed a fracture code for this encounter, all subsequent visits will be post-op checks in the global period.

## 2016-04-07 NOTE — Progress Notes (Signed)
   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 Pain is moderate, persistent, localized without radiation.  Past medical history, Surgical history, Family history not pertinant except as noted below, Social history, Allergies, and medications have been entered into the medical record, reviewed, and no changes needed.   Review of Systems: No headache, visual changes, nausea, vomiting, diarrhea, constipation, dizziness, abdominal pain, skin rash, fevers, chills, night sweats, weight loss, swollen lymph nodes, body aches, joint swelling, muscle aches, chest pain, shortness of breath, mood changes, visual or auditory hallucinations.   Objective:   General: Well Developed, well nourished, and in no acute distress.  Neuro/Psych: Alert and oriented x3, extra-ocular muscles intact, able to move all 4 extremities, sensation grossly intact. Skin: Warm and dry, no rashes noted.  Respiratory: Not using accessory muscles, speaking in full sentences, trachea midline.  Cardiovascular: Pulses palpable, no extremity edema. Abdomen: Does not appear distended. Right Foot: Range of motion is full in all directions. Strength is 5/5 in all directions. No hallux valgus. No pes cavus or pes planus. No abnormal callus noted. No pain over the navicular prominence, or base of fifth metatarsal. No tenderness to palpation of the calcaneal insertion of plantar fascia. No pain at the Achilles insertion. No pain over the calcaneal bursa. No pain of the retrocalcaneal bursa. Tender to palpation at the base of the fifth metatarsal with swelling and bruising No hallux rigidus or limitus. No tenderness  palpation over interphalangeal joints. No pain with compression of the metatarsal heads. Neurovascularly intact distally.  X-ray show a transverse fracture through the base of the fifth metatarsal, appears just proximal to the Jones territory.  Short-leg cast placed.  Impression and Recommendations:   This case required medical decision making of moderate complexity.

## 2016-05-01 ENCOUNTER — Ambulatory Visit (INDEPENDENT_AMBULATORY_CARE_PROVIDER_SITE_OTHER): Payer: BC Managed Care – PPO

## 2016-05-01 ENCOUNTER — Other Ambulatory Visit: Payer: Self-pay | Admitting: Family Medicine

## 2016-05-01 ENCOUNTER — Telehealth: Payer: Self-pay | Admitting: Sports Medicine

## 2016-05-01 DIAGNOSIS — X58XXXD Exposure to other specified factors, subsequent encounter: Secondary | ICD-10-CM

## 2016-05-01 DIAGNOSIS — S92351D Displaced fracture of fifth metatarsal bone, right foot, subsequent encounter for fracture with routine healing: Secondary | ICD-10-CM

## 2016-05-01 DIAGNOSIS — S92351A Displaced fracture of fifth metatarsal bone, right foot, initial encounter for closed fracture: Secondary | ICD-10-CM

## 2016-05-01 DIAGNOSIS — Z1231 Encounter for screening mammogram for malignant neoplasm of breast: Secondary | ICD-10-CM

## 2016-05-01 NOTE — Addendum Note (Signed)
Addended by: Monica BectonHEKKEKANDAM, Jamarri Vuncannon J on: 05/01/2016 02:32 PM   Modules accepted: Orders

## 2016-05-01 NOTE — Telephone Encounter (Signed)
X-rays ordered. They may shoot through the cast.

## 2016-05-01 NOTE — Telephone Encounter (Signed)
Pt called clinic stating she fell on her fractured foot and heard a "snap." Pt is in a cast so unable to determine if the area is swollen and/or bruised. Pt reports no discomfort in cast currently. Spoke with treating Physician, Pt to go get new xray today of foot. There is already an order in chart. No further action required.

## 2016-05-02 ENCOUNTER — Encounter: Payer: Self-pay | Admitting: Sports Medicine

## 2016-05-06 ENCOUNTER — Ambulatory Visit (INDEPENDENT_AMBULATORY_CARE_PROVIDER_SITE_OTHER): Payer: BC Managed Care – PPO

## 2016-05-06 DIAGNOSIS — Z1231 Encounter for screening mammogram for malignant neoplasm of breast: Secondary | ICD-10-CM | POA: Diagnosis not present

## 2016-05-07 ENCOUNTER — Ambulatory Visit (INDEPENDENT_AMBULATORY_CARE_PROVIDER_SITE_OTHER): Payer: BC Managed Care – PPO | Admitting: Sports Medicine

## 2016-05-07 ENCOUNTER — Ambulatory Visit (INDEPENDENT_AMBULATORY_CARE_PROVIDER_SITE_OTHER): Payer: BC Managed Care – PPO

## 2016-05-07 VITALS — BP 118/79 | HR 75 | Resp 18 | Wt 260.0 lb

## 2016-05-07 DIAGNOSIS — X58XXXD Exposure to other specified factors, subsequent encounter: Secondary | ICD-10-CM

## 2016-05-07 DIAGNOSIS — M7541 Impingement syndrome of right shoulder: Secondary | ICD-10-CM | POA: Diagnosis not present

## 2016-05-07 DIAGNOSIS — S92351D Displaced fracture of fifth metatarsal bone, right foot, subsequent encounter for fracture with routine healing: Secondary | ICD-10-CM | POA: Diagnosis not present

## 2016-05-07 NOTE — Progress Notes (Signed)
   Subjective:    I'm seeing this patient as a consultation for:  Julie DickerErin Judge, NP  CC: Follow-up fracture and right shoulder pain  HPI: This is a pleasant 45 year old here, she is here to follow-up a fifth metatarsal fracture that's been in a cast now for 4 weeks, overall doing significantly better, very little pain. She is doing well with her rolling knee scooter and nonweightbearing.  She also has complaints of right shoulder pain for the past couple of months, localized of the deltoid, worse with overhead activities, moderate, persistent, radiation, no trauma, no constitutional symptoms. No mechanical symptoms.  Past medical history, Surgical history, Family history not pertinant except as noted below, Social history, Allergies, and medications have been entered into the medical record, reviewed, and no changes needed.   Review of Systems: No headache, visual changes, nausea, vomiting, diarrhea, constipation, dizziness, abdominal pain, skin rash, fevers, chills, night sweats, weight loss, swollen lymph nodes, body aches, joint swelling, muscle aches, chest pain, shortness of breath, mood changes, visual or auditory hallucinations.   Objective:   General: Well Developed, well nourished, and in no acute distress.  Neuro/Psych: Alert and oriented x3, extra-ocular muscles intact, able to move all 4 extremities, sensation grossly intact. Skin: Warm and dry, no rashes noted.  Respiratory: Not using accessory muscles, speaking in full sentences, trachea midline.  Cardiovascular: Pulses palpable, no extremity edema. Abdomen: Does not appear distended. Right Shoulder: Inspection reveals no abnormalities, atrophy or asymmetry. Palpation is normal with no tenderness over AC joint or bicipital groove. ROM is full in all planes. Rotator cuff strength normal throughout. Positive Neer and Hawkin's tests, empty can. Speeds and Yergason's tests normal. No labral pathology noted with negative Obrien's,  negative crank, negative clunk, and good stability. Normal scapular function observed. No painful arc and no drop arm sign. No apprehension sign Right leg: Cast is in good shape. Neurovascularly intact distally. It is a bit loose however.  Impression and Recommendations:   This case required medical decision making of moderate complexity.

## 2016-05-07 NOTE — Assessment & Plan Note (Signed)
Formal physical therapy. Return to see me in 6 weeks regarding this, at that point her fracture will be healed and we consider injection if no better.

## 2016-05-07 NOTE — Assessment & Plan Note (Signed)
Continue cast, she is 4 weeks out now. Return to see me in 2 weeks for cast removal, we will place a boot if still painful at that time.  No further x-rays needed, she is showing good stability of her fracture.

## 2016-05-08 ENCOUNTER — Encounter: Payer: Self-pay | Admitting: Emergency Medicine

## 2016-05-08 ENCOUNTER — Emergency Department
Admission: EM | Admit: 2016-05-08 | Discharge: 2016-05-08 | Disposition: A | Discharge status: Home / Self Care | Payer: BC Managed Care – PPO | Source: Home / Self Care | Attending: Family Medicine | Admitting: Family Medicine

## 2016-05-08 ENCOUNTER — Emergency Department (INDEPENDENT_AMBULATORY_CARE_PROVIDER_SITE_OTHER): Payer: BC Managed Care – PPO

## 2016-05-08 ENCOUNTER — Encounter: Payer: Self-pay | Admitting: Sports Medicine

## 2016-05-08 DIAGNOSIS — S52532A Colles' fracture of left radius, initial encounter for closed fracture: Secondary | ICD-10-CM

## 2016-05-08 DIAGNOSIS — W010XXA Fall on same level from slipping, tripping and stumbling without subsequent striking against object, initial encounter: Secondary | ICD-10-CM

## 2016-05-08 DIAGNOSIS — Z4800 Encounter for change or removal of nonsurgical wound dressing: Secondary | ICD-10-CM

## 2016-05-08 DIAGNOSIS — W1839XA Other fall on same level, initial encounter: Secondary | ICD-10-CM | POA: Diagnosis not present

## 2016-05-08 DIAGNOSIS — M79671 Pain in right foot: Secondary | ICD-10-CM

## 2016-05-08 DIAGNOSIS — Z4689 Encounter for fitting and adjustment of other specified devices: Secondary | ICD-10-CM

## 2016-05-08 DIAGNOSIS — W19XXXA Unspecified fall, initial encounter: Secondary | ICD-10-CM

## 2016-05-08 NOTE — Discharge Instructions (Signed)
Please take your previously prescribed pain medication as indicated on prescription bottle.  Please treat cam walker boot as you would treat the cast you had removed today as you had stated the cast was to remain in place another 2 weeks. It is important to follow the instructions provided to you during your initial Right foot injury.  No weight bearing.   Please follow up with Dr. Benjamin Stainhekkekandam for Right foot fracture as previously scheduled but be sure to make an appointment for your Left wrist fracture as you may need to be seen sooner for a proper cast to be placed.   Colles Fracture Colles fracture is a type of broken wrist. It means that your radius bone is broken or cracked. The radius is the bone of your forearm on the same side as your thumb. The forearm is the part of your arm that is between your elbow and your wrist. Your forearm is made up of two bones. The other bone is called the ulna. Often, when the radius is broken, the ulna may also be broken. A cast or splint is used to protect and prevent your injured bone from moving as it heals. CAUSES Common causes of this type of fracture include:  A hard, direct hit to the wrist.  Falling on an outstretched hand.  Trauma, such as a car accident or a fall. RISK FACTORS You may be at higher risk for this type of fracture if:  You participate in contact sports and high-risk sports, such as skiing, biking, and ice skating.  You smoke.  You drink more than three alcoholic beverages per day.  You have low or lowered bone density (osteoporosis or osteopenia).  You are a young child or an older adult.  You are a woman who has gone through menopause.  You have a history of previous fractures.  You are not getting enough calcium or vitamin D. SIGNS AND SYMPTOMS Symptoms of Colles fracture may include tenderness, bruising, and inflammation. Additionally, your wrist may hang in an odd position, appear deformed, and be difficult to  move. DIAGNOSIS Diagnosis may include:  Physical exam.  X-ray. TREATMENT Treatment depends on many factors, including the severity of the fracture, your age, and your activity level. Treatment for Colles fracture can be nonsurgical or surgical. Nonsurgical Treatment A cast or a splint may be applied to your wrist if the bone is in a good position. If the fracture is not in a good position, it may be necessary for your health care provider to realign it before applying a cast or a splint. Usually, a cast or a splint will be worn for several weeks. Surgical Treatment Sometimes, if the fractured bone is severely displaced, surgery is required to help hold it together as it heals. Depending on the fracture, there are a number of options for holding the bone in place while it heals, such as a cast and metal pins. HOME CARE INSTRUCTIONS If You Have a Cast:  Do not stick anything inside the cast to scratch your skin. Doing that increases your risk of infection.  Check the skin around the cast every day. Report any concerns to your health care provider. You may put lotion on dry skin around the edges of the cast. Do not apply lotion to the skin underneath the cast. If You Have a Splint:  Wear it as directed by your health care provider. Remove it only as directed by your health care provider.  Loosen the splint if your fingers  become numb and tingle, or if they turn cold and blue. Bathing  Cover the cast or splint with a watertight plastic bag to protect it from water while you bathe or shower. Do not let the cast or splint get wet. Managing Pain, Stiffness, and Swelling  If directed, apply ice to the injured area:  Put ice in a plastic bag.  Place a towel between your skin and the bag.  Leave the ice on for 20 minutes, 2-3 times a day.  Move your fingers often to avoid stiffness and to lessen swelling.  Raise the injured area above the level of your heart while you are sitting or  lying down. Driving  Do not drive or operate heavy machinery while taking pain medicine.  Do not drive while wearing a cast or splint on a hand that you use for driving. Activity  Return to your normal activities as directed by your health care provider. Ask your health care provider what activities are safe for you.  Perform range-of-motion exercises only as directed by your health care provider. Safety  Do not use your injured limb to support your body weight until your health care provider says that you can. General Instructions  Do not put pressure on any part of the cast or splint until it is fully hardened. This may take several hours.  Keep the cast or splint clean and dry.  Do not use any tobacco products, including cigarettes, chewing tobacco, or electronic cigarettes. Tobacco can delay bone healing. If you need help quitting, ask your health care provider.  Take medicines only as directed by your health care provider.  Keep all follow-up visits as directed by your health care provider. This is important. SEEK MEDICAL CARE IF:  Your cast or splint becomes wet or damaged or suddenly feels too tight.  You have a fever.  You have chills.  You have continued severe pain or more swelling than you did before the cast or splint was put on your wrist. SEEK IMMEDIATE MEDICAL CARE IF:  Your hand or fingernails on the injured arm turn blue or gray, or they feel cold or numb.  You have decreased feeling in the fingers of your injured arm.   This information is not intended to replace advice given to you by your health care provider. Make sure you discuss any questions you have with your health care provider.   Document Released: 10/28/2006 Document Revised: 11/02/2014 Document Reviewed: 05/28/2014 Elsevier Interactive Patient Education 2016 [employer]  Cast or Splint Care Casts and splints support injured limbs and keep bones from moving while they heal.  HOME  CARE  Keep the cast or splint uncovered during the drying period.  A plaster cast can take 24 to 48 hours to dry.  A fiberglass cast will dry in less than 1 hour.  Do not rest the cast on anything harder than a pillow for 24 hours.  Do not put weight on your injured limb. Do not put pressure on the cast. Wait for your doctor's approval.  Keep the cast or splint dry.  Cover the cast or splint with a plastic bag during baths or wet weather.  If you have a cast over your chest and belly (trunk), take sponge baths until the cast is taken off.  If your cast gets wet, dry it with a towel or blow dryer. Use the cool setting on the blow dryer.  Keep your cast or splint clean. Wash a dirty cast with a  damp cloth.  Do not put any objects under your cast or splint.  Do not scratch the skin under the cast with an object. If itching is a problem, use a blow dryer on a cool setting over the itchy area.  Do not trim or cut your cast.  Do not take out the padding from inside your cast.  Exercise your joints near the cast as told by your doctor.  Raise (elevate) your injured limb on 1 or 2 pillows for the first 1 to 3 days. GET HELP IF:  Your cast or splint cracks.  Your cast or splint is too tight or too loose.  You itch badly under the cast.  Your cast gets wet or has a soft spot.  You have a bad smell coming from the cast.  You get an object stuck under the cast.  Your skin around the cast becomes red or sore.  You have new or more pain after the cast is put on. GET HELP RIGHT AWAY IF:  You have fluid leaking through the cast.  You cannot move your fingers or toes.  Your fingers or toes turn blue or white or are cool, painful, or puffy (swollen).  You have tingling or lose feeling (numbness) around the injured area.  You have bad pain or pressure under the cast.  You have trouble breathing or have shortness of breath.  You have chest pain.   This information is not  intended to replace advice given to you by your health care provider. Make sure you discuss any questions you have with your health care provider.   Document Released: 02/11/2011 Document Revised: 06/14/2013 Document Reviewed: 04/20/2013 Elsevier Interactive Patient Education [employer]

## 2016-05-08 NOTE — ED Notes (Signed)
Fell this morning landing on Left wrist, painful, 7/10

## 2016-05-08 NOTE — ED Provider Notes (Signed)
CSN: [id number]     Arrival date & time 05/08/16  1438 History   First MD Initiated Contact with Patient 05/08/16 1453     Chief Complaint  Patient presents with  . Fall  . Wrist Pain   (Consider location/radiation/quality/duration/timing/severity/associated sxs/prior Treatment) HPI  Boris LownHelen Kenney is a 45 y.o. female presenting to [facility] with c/o sudden onset Left wrist pain, swelling and decreased ROM that started around 1PM after trip and fall onto wrist.  Pain is aching and throbbing, 7/10,  Worse with any movement or palpation.  No pain medication taken PTA. Denies elbow or shoulder injury. Denies hitting her head or other injuries from the fall.   She notes she was initially headed to UC today to see if she could have a cast removed from her Right foot due to increased pain at the top of her foot.  Pt concerned for a pressure sore in her cast.  She notes she is suppose to wear cast for another 2 weeks but is fearful to wait until Monday, she is concerned "pressure sore" will continue to worsen.  She is seen by Dr. Benjamin Stainhekkekandam, Sports Medicine.    Past Medical History  Diagnosis Date  . Carpal tunnel syndrome   . Migraine   . Sleep apnea   . Depression   . Seasonal allergies    Past Surgical History  Procedure Laterality Date  . Hernia repair  1993  . Carpal tunnel release  2009   Family History  Problem Relation Age of Onset  . Esophageal cancer Father   . Stroke Father   . Coronary artery disease Father   . Hypertension Father   . Heart attack Father   . Depression Mother   . Carpal tunnel syndrome Daughter    Social History  Substance Use Topics  . Smoking status: Never Smoker   . Smokeless tobacco: None  . Alcohol Use: Yes     Comment: Rare   OB History    No data available     Review of Systems  Musculoskeletal: Positive for myalgias, joint swelling and arthralgias.  Skin: Negative for color change and wound.  Neurological: Negative for weakness and numbness.     Allergies  Review of patient's allergies indicates no known allergies.  Home Medications   Prior to Admission medications   Medication Sig Start Date End Date Taking? Authorizing Provider  AMBULATORY NON FORMULARY MEDICATION Rolling Knee Scooter, please take Rx to medical supply store. 04/07/16   Monica Bectonhomas J Thekkekandam, MD  cetirizine (ZYRTEC) 10 MG tablet Take 10 mg by mouth daily.    Historical Provider, MD  citalopram (CELEXA) 40 MG tablet Take 1 tablet (40 mg total) by mouth daily. 12/13/14   Thresa RossNadeem Akhtar, MD  HYDROcodone-acetaminophen (NORCO/VICODIN) 5-325 MG tablet Take 1 tablet by mouth every 8 (eight) hours as needed for moderate pain. 04/07/16   Monica Bectonhomas J Thekkekandam, MD  PROAIR HFA 108 [phone](90 BASE) MCG/ACT inhaler  04/07/13   Historical Provider, MD  SUMAtriptan (IMITREX) 100 MG tablet Take 100 mg by mouth Once daily as needed. 08/12/12   Historical Provider, MD   Meds Ordered and Administered this Visit  Medications - No data to display  BP 145/81 mmHg  Pulse 86  Temp(Src) 98 F (36.7 C) (Oral)  Ht 5\' 6"  (1.676 m)  Wt 255 lb (115.667 kg)  BMI 41.18 kg/m2  SpO2 96%  LMP 04/21/2016 No data found.   Physical Exam  Constitutional: She is oriented to person, place, and  time. She appears well-developed and well-nourished.  HENT:  Head: Normocephalic and atraumatic.  Eyes: EOM are normal.  Neck: Normal range of motion.  Cardiovascular: Normal rate.   Pulses:      Radial pulses are 2+ on the left side.       Dorsalis pedis pulses are 2+ on the right side.  Pulmonary/Chest: Effort normal.  Musculoskeletal: She exhibits edema and tenderness.  Left wrist: mild to moderate edema, diffuse tenderness. Unable to flex or extend wrist due to pain. 4/5 grip strength compared to Right hand.  Full ROM Left elbow and shoulder, non-tender.   Right foot (after cast removal): slight decreased flexion and extension of foot. Tenderness to dorsum of foot. No edema (see skin exam).   Neurological: She is alert and oriented to person, place, and time.  Left hand: normal sensation Right foot: normal sensation  Skin: Skin is warm and dry. No rash noted. No erythema.  Left hand and wrist: skin in tact. No ecchymosis or erythema   Right foot: skin in tact, no ecchymosis, erythema, sores or ulcerations.   Psychiatric: She has a normal mood and affect. Her behavior is normal.  Nursing note and vitals reviewed.   ED Course  .Splint Application Date/Time: 05/08/2016 4:56 PM Performed by: Junius Finner Authorized by: Donna Christen A Consent: Verbal consent obtained. Risks and benefits: risks, benefits and alternatives were discussed Consent given by: patient Patient understanding: patient states understanding of the procedure being performed Patient consent: the patient's understanding of the procedure matches consent given Imaging studies: imaging studies available Required items: required blood products, implants, devices, and special equipment available Patient identity confirmed: verbally with patient Location details: left wrist Splint type: sugar tong Supplies used: cotton padding,  Ortho-Glass and elastic bandage Post-procedure: The splinted body part was neurovascularly unchanged following the procedure. Patient tolerance: Patient tolerated the procedure well with no immediate complications   (including critical care time)  Labs Review Labs Reviewed - No data to display  Imaging Review Dg Wrist Complete Left  05/08/2016  CLINICAL DATA:  45 y/o  F; left wrist pain and bruising after fall. EXAM: LEFT WRIST - COMPLETE 3+ VIEW COMPARISON:  None. FINDINGS: Mildly impacted comminuted volar angulated and nondisplaced fracture of the distal ulnar metaphysis. Probable dorsal intra-articular extension. Soft tissue swelling about the wrist joint. No other fracture or dislocation is identified. IMPRESSION: Mildly angulated nondisplaced Colles fracture. Possible  intra-articular extension dorsally. No other fracture is identified. Electronically Signed   By: Mitzi Hansen M.D.   On: 05/08/2016 15:47   Dg Foot Complete Right  05/07/2016  CLINICAL DATA:  One month follow-up from fifth metatarsal fracture EXAM: RIGHT FOOT COMPLETE - 3+ VIEW COMPARISON:  05/01/2016 FINDINGS: Casting material is again noted which limits fine bony detail. A fracture through the base of the fifth metatarsal is again seen. No significant callus formation is noted. No other focal abnormality is seen. IMPRESSION: Stable appearing fifth metatarsal fracture without significant callus formation. Electronically Signed   By: Alcide Clever M.D.   On: 05/07/2016 08:44     MDM   1. Colles' fracture of left radius, closed, initial encounter   2. Fall from slip, trip, or stumble, initial encounter   3. Cast removal   4. Right foot pain    Pt presenting to [facility] with Left wrist pain, swelling and decreased ROM.  Neurovascularly in tact. Skin in tact.   Plain films c/w mildly angulated nondisplaced Colles fracture, possible intra-articular extension dorsally.  Pt placed in Sugar-tong splint, sling provided for comfort and to help keep wrist elevated to decrease swelling. Wedding band switched to Right hand for time being.   Consulted with Dr. Benjamin Stain about cast and wrist.  Pt to call to schedule f/u for wrist within 1 week.  Adelina Mings RN was able to remove pt's cast today and place pt in cam walker boot instead. NO pressure sores or skin irritated noted on exam.   Advised to continue to remain non-weight bearing and follow instructions provided to her for Right foot fracture from initial injury.    Junius Finner, PA-C 05/08/16 1756

## 2016-05-08 NOTE — ED Notes (Signed)
Patient took hydrocodone which she had herself at 3:05, pain was better after about an hour.

## 2016-05-09 ENCOUNTER — Encounter: Payer: Self-pay | Admitting: Sports Medicine

## 2016-05-10 ENCOUNTER — Emergency Department (INDEPENDENT_AMBULATORY_CARE_PROVIDER_SITE_OTHER): Payer: BC Managed Care – PPO

## 2016-05-10 ENCOUNTER — Encounter: Payer: Self-pay | Admitting: Sports Medicine

## 2016-05-10 ENCOUNTER — Emergency Department (INDEPENDENT_AMBULATORY_CARE_PROVIDER_SITE_OTHER)
Admission: EM | Admit: 2016-05-10 | Discharge: 2016-05-10 | Disposition: A | Discharge status: Home / Self Care | Payer: BC Managed Care – PPO | Source: Home / Self Care | Attending: Family Medicine | Admitting: Family Medicine

## 2016-05-10 DIAGNOSIS — S52502D Unspecified fracture of the lower end of left radius, subsequent encounter for closed fracture with routine healing: Secondary | ICD-10-CM | POA: Diagnosis not present

## 2016-05-10 DIAGNOSIS — Z09 Encounter for follow-up examination after completed treatment for conditions other than malignant neoplasm: Secondary | ICD-10-CM

## 2016-05-10 DIAGNOSIS — S52602A Unspecified fracture of lower end of left ulna, initial encounter for closed fracture: Secondary | ICD-10-CM | POA: Diagnosis not present

## 2016-05-10 DIAGNOSIS — W19XXXD Unspecified fall, subsequent encounter: Secondary | ICD-10-CM

## 2016-05-10 DIAGNOSIS — M25532 Pain in left wrist: Secondary | ICD-10-CM

## 2016-05-10 MED ORDER — HYDROMORPHONE HCL 2 MG PO TABS: ORAL_TABLET | ORAL | Status: DC

## 2016-05-10 NOTE — Discharge Instructions (Signed)
°  Please follow instructions given to you by Dr. Benjamin Stainhekkekandam, Sports Medicine.

## 2016-05-10 NOTE — ED Provider Notes (Signed)
[facility] Dr[facility] Benjamin Stainhekkekandam for further evaluation and treatment of her Left wrist fracture as pt has c/o splint becoming tight.  Be was seen by Dr. Benjamin Stainhekkekandam in [facility] as it is a weekend.  See note from Dr. Lucretia Fieldhekkekandam.    Travion Ke O'Malley, PA-C 05/10/16 1226

## 2016-05-10 NOTE — Consult Note (Signed)
   Subjective:    I'm seeing this patient as a consultation for:  Jerrel IvoryEric O'Malley PA-C  CC: Forearm fracture  HPI: This is a pleasant 45 year old female, she has a fifth metatarsal fracture, unfortunately fell and fractured her left distal radius. Pain is severe, persistent. She was placed initially on splint appropriately and she presents today for fracture reduction.  Pain is localized over the dorsal distal radius with swelling and bruising, doesn't radiate, no paresthesias distally.  Past medical history, Surgical history, Family history not pertinant except as noted below, Social history, Allergies, and medications have been entered into the medical record, reviewed, and no changes needed.   Review of Systems: No headache, visual changes, nausea, vomiting, diarrhea, constipation, dizziness, abdominal pain, skin rash, fevers, chills, night sweats, weight loss, swollen lymph nodes, body aches, joint swelling, muscle aches, chest pain, shortness of breath, mood changes, visual or auditory hallucinations.   Objective:   General: Well Developed, well nourished, and in no acute distress.  Neuro/Psych: Alert and oriented x3, extra-ocular muscles intact, able to move all 4 extremities, sensation grossly intact. Skin: Warm and dry, no rashes noted.  Respiratory: Not using accessory muscles, speaking in full sentences, trachea midline.  Cardiovascular: Pulses palpable, no extremity edema. Abdomen: Does not appear distended.  Procedure:  Fracture Reduction   Risks, benefits, and alternatives explained and consent obtained. Time out conducted. Surface prepped with alcohol. 5cc lidocaine and 5 mL Marcaine infiltrated in a hematoma block. Adequate anesthesia ensured. Fracture reduction: I applied axial traction while my assistant provided countertraction, I then accentuated the angulation of the fracture, and then reduced it. Sugar tong splint was applied in slight flexion. Post reduction films  obtained showed anatomic/near-anatomic alignment. Pt stable, aftercare and follow-up advised.  Impression and Recommendations:   This case required medical decision making of moderate complexity.  1. Angulated, left distal radius Colles' fracture:   Closed reduction as above, hydromorphone for pain, Sugar tong splint.  Return to see me middle of next week to recheck fracture alignment, x-rays before visit.  I billed a fracture code for this encounter, all subsequent visits will be post-op checks in the global period.  ___________________________________________ Ihor Austinhomas J. Benjamin Stainhekkekandam, M.D., ABFM., CAQSM. Primary Care and Sports Medicine [facility]  Adjunct Instructor of Family Medicine  [employer]

## 2016-05-10 NOTE — ED Notes (Signed)
BP 117/84, P 72, O2- 96

## 2016-05-10 NOTE — ED Notes (Signed)
BP 115/82, P-62, O2- 94.

## 2016-05-10 NOTE — ED Notes (Signed)
Pt here Friday, broke left wrist.  Dr T here for a closed reduction.

## 2016-05-12 ENCOUNTER — Ambulatory Visit (INDEPENDENT_AMBULATORY_CARE_PROVIDER_SITE_OTHER): Payer: BC Managed Care – PPO

## 2016-05-12 ENCOUNTER — Encounter: Payer: Self-pay | Admitting: Sports Medicine

## 2016-05-12 ENCOUNTER — Encounter: Payer: Self-pay | Admitting: Rehabilitative and Restorative Service Providers"

## 2016-05-12 ENCOUNTER — Ambulatory Visit (INDEPENDENT_AMBULATORY_CARE_PROVIDER_SITE_OTHER): Payer: BC Managed Care – PPO | Admitting: Rehabilitative and Restorative Service Providers"

## 2016-05-12 ENCOUNTER — Ambulatory Visit (INDEPENDENT_AMBULATORY_CARE_PROVIDER_SITE_OTHER): Payer: BC Managed Care – PPO | Admitting: Sports Medicine

## 2016-05-12 VITALS — BP 135/89 | HR 90 | Resp 18 | Wt 255.0 lb

## 2016-05-12 DIAGNOSIS — M25511 Pain in right shoulder: Secondary | ICD-10-CM | POA: Diagnosis not present

## 2016-05-12 DIAGNOSIS — R29898 Other symptoms and signs involving the musculoskeletal system: Secondary | ICD-10-CM | POA: Diagnosis not present

## 2016-05-12 DIAGNOSIS — W19XXXA Unspecified fall, initial encounter: Secondary | ICD-10-CM

## 2016-05-12 DIAGNOSIS — R293 Abnormal posture: Secondary | ICD-10-CM

## 2016-05-12 DIAGNOSIS — S52532A Colles' fracture of left radius, initial encounter for closed fracture: Secondary | ICD-10-CM | POA: Diagnosis not present

## 2016-05-12 DIAGNOSIS — S52502A Unspecified fracture of the lower end of left radius, initial encounter for closed fracture: Secondary | ICD-10-CM | POA: Insufficient documentation

## 2016-05-12 DIAGNOSIS — S52502D Unspecified fracture of the lower end of left radius, subsequent encounter for closed fracture with routine healing: Secondary | ICD-10-CM

## 2016-05-12 NOTE — Patient Instructions (Signed)
Shoulder Blade Squeeze    Rotate shoulders back, then squeeze shoulder blades down and back. Hold 10-15 sec Repeat __10__ times. Do _several___ sessions per day.   Scapula Adduction With Pectoralis Stretch: Low - Standing   Shoulders at 45 hands even with shoulders, keeping weight through legs, shift weight forward until you feel pull or stretch through the front of your chest. Hold _30__ seconds. Do _3__ times, _2-4__ times per day.   Scapula Adduction With Pectoralis Stretch: Mid-Range - Standing   Shoulders at 90 elbows even with shoulders, keeping weight through legs, shift weight forward until you feel pull or strength through the front of your chest. Hold __30_ seconds. Do _3__ times, __2-4_ times per day.   Scapula Adduction With Pectoralis Stretch: High - Standing   Shoulders at 120 hands up high on the doorway, keeping weight on feet, shift weight forward until you feel pull or stretch through the front of your chest. Hold _30__ seconds. Do _3__ times, _2-3__ times per day.   TENS UNIT: This is helpful for muscle pain and spasm.   Search and Purchase a TENS 7000 2nd edition at www.tenspros.com. It should be less than $30.     TENS unit instructions: Do not shower or bathe with the unit on Turn the unit off before removing electrodes or batteries If the electrodes lose stickiness add a drop of water to the electrodes after they are disconnected from the unit and place on plastic sheet. If you continued to have difficulty, call the TENS unit company to purchase more electrodes. Do not apply lotion on the skin area prior to use. Make sure the skin is clean and dry as this will help prolong the life of the electrodes. After use, always check skin for unusual red areas, rash or other skin difficulties. If there are any skin problems, does not apply electrodes to the same area. Never remove the electrodes from the unit by pulling the wires. Do not use the TENS unit or  electrodes other than as directed. Do not change electrode placement without consultating your therapist or physician. Keep 2 fingers with between each electrode.

## 2016-05-12 NOTE — Therapy (Signed)
[facility] [place] [address] [place], [place], [address] Phone: [phone]   Fax:  [phone]  Physical Therapy Evaluation  Patient Details  Name: Julie Schaefer MRN: [id number] Date of Birth: 1971-09-08 Referring Provider: Dr. Megan Salon   Encounter Date: 05/12/2016      PT End of Session - 05/12/16 1103    Visit Number 1   Number of Visits 12   Date for PT Re-Evaluation 06/19/16   PT Start Time 1020   PT Stop Time 1114   PT Time Calculation (min) 54 min   Activity Tolerance Patient tolerated treatment well      Past Medical History  Diagnosis Date  . Carpal tunnel syndrome   . Migraine   . Sleep apnea   . Depression   . Seasonal allergies     Past Surgical History  Procedure Laterality Date  . Hernia repair  1993  . Carpal tunnel release  2009    There were no vitals filed for this visit.       Subjective Assessment - 05/12/16 1025    Subjective Julie Schaefer reports that she has had Rt shoulder pain over the course of the past several months (~6 months) No known injury    Pertinent History Fx 5 metatarsal Rt foot 04/06/16 in walking boot; fx Lt radius fx closed reduction 05/08/16 in cast and sling; CT sx Rt wrist 2009    How long can you sit comfortably? no limit    Patient Stated Goals move any direction she wants without pain    Currently in Pain? Yes   Pain Score 8   with movement    Pain Location Shoulder   Pain Orientation Right   Pain Descriptors / Indicators Sharp;Stabbing   Pain Type Chronic pain   Pain Radiating Towards down into Rt arm to mid arm/deltoid    Pain Onset More than a month ago   Pain Frequency Intermittent   Aggravating Factors  moving arm in different directions above head/behind back    Pain Relieving Factors avoiding movement that hurts; moving in different direction             [facility] PT Assessment - 05/12/16 0001    Assessment   Medical Diagnosis Rt shoulder impingement    Referring Provider Dr. Megan Salon    Onset Date/Surgical Date 11/27/15   Hand Dominance Right   Next MD Visit 05/21/16 (foot) 05/26/16 (wrist)    Prior Therapy none   Precautions   Precaution Comments no functional activites  Lt UE; NWB Rt LE    Required Braces or Orthoses --  cast Lt wrist; walking boot Rt LE    Balance Screen   Has the patient fallen in the past 6 months Yes   How many times? 2   Has the patient had a decrease in activity level because of a fear of falling?  No   Is the patient reluctant to leave their home because of a fear of falling?  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[employer]    Leisure household chores; piano; knit/chrochett    Observation/Other Assessments   Focus on Therapeutic Outcomes (FOTO)  43% limitation    Sensation   Additional Comments WFL's per pt report    Posture/Postural Control  Posture Comments sitting - head forward; shoulders rounded and elevated; increased thoracic kyphosis; scapulae abducted and rotated along the thoracic wall    AROM   Right Shoulder Extension 67 Degrees   Right Shoulder Flexion 133 Degrees  mild pain    Right Shoulder ABduction 135 Degrees  mild pain    Right Shoulder Internal Rotation 31 Degrees   Right Shoulder External Rotation 69 Degrees  pain    Cervical Flexion 61   Cervical Extension 49   Cervical - Right Side Bend 37   Cervical - Left Side Bend 36   Cervical - Right Rotation 75   Cervical - Left Rotation 78   Strength   Overall Strength Comments 5/5 Rt shoulder - poor strength posterior shoulder girdle musculature    Palpation   Palpation comment tight pecs; upper trap; leveator; teres Rt shd    Special Tests    Special Tests --  empty can (-)                    [facility] Adult PT  Treatment/Exercise - 05/12/16 0001    Neuro Re-ed    Neuro Re-ed Details  working on posture and alignment engaging posterior shoulder girdle musculature    Shoulder Exercises: Seated   Other Seated Exercises scap squeeze with noodle 10 sec x 10    Shoulder Exercises: Stretch   Cross Chest Stretch 2 reps;30 seconds  sitting arm behind back at edge of table    Other Shoulder Stretches 2-3 way doorway sitting at doorway    Cryotherapy   Number Minutes Cryotherapy 15 Minutes   Cryotherapy Location Shoulder  Rt   Type of Cryotherapy Ice pack   Electrical Stimulation   Electrical Stimulation Location Rt shoulder    Electrical Stimulation Action IFC   Electrical Stimulation Parameters to toleranc   Electrical Stimulation Goals Pain;Tone                PT Education - 05/12/16 1058    Education provided Yes   Education Details postural correction; HEP; TENs unit    Person(s) Educated Patient   Methods Explanation;Demonstration;Tactile cues;Verbal cues;Handout   Comprehension Verbalized understanding;Returned demonstration;Verbal cues required;Tactile cues required             PT Long Term Goals - 05/12/16 1319    PT LONG TERM GOAL #1   Title Improve posture and alignment with patient to demonstrated improved upright cervical and shoulder postiion 06/19/16   Time 6   Period Weeks   Status New   PT LONG TERM GOAL #2   Title Improve strength through posterior shoulder girdle musculature with patient to maintain good scapular position for UE exercise and ADL's thus avoiding pain and impingement 06/22/16   Time 6   Period Weeks   Status New   PT LONG TERM GOAL #3   Title Increased AROM Rt shoulder by 10-15 degrees flex/abd/IR/ER 06/19/16   Time 6   Period Weeks   Status New   PT LONG TERM GOAL #4   Title Independent in HEP 06/19/16   Time 6   Period Weeks   Status New   PT LONG TERM GOAL #5   Title Improve FOTO to </= 30% limitation 06/22/16   Time 6   Period Weeks    Status New               Plan - 05/12/16 1104    Clinical Impression Statement Patient presents with chronic Rt shoulder pain  consistent with diagnosis of impingement. She has poor posture and alignment; poor scapular position/posterior shoulder girdle strength; abnormal/limited Rt shoulder ROM; pain with functional activities. Complicating shoulder rehab are Fx of Rt foot and Lt wrist. Patient is NWB Rt LE and in a cast  and sling Lt UE.    Rehab Potential Good   PT Frequency 2x / week   PT Duration 6 weeks   PT Treatment/Interventions Patient/family education;ADLs/Self Care Home Management;Cryotherapy;Electrical Stimulation;Iontophoresis 4mg /ml Dexamethasone;Moist Heat;Ultrasound;Neuromuscular re-education;Manual techniques;Dry needling;Therapeutic activities;Therapeutic exercise   PT Next Visit Plan stretch pecs - supine prolonged snow angel; posterior shoulder girdle work as tolerated; manual work (TDN as indicated); modaliites as indicated    Financial planner with Plan of Care Patient      Patient will benefit from skilled therapeutic intervention in order to improve the following deficits and impairments:  Postural dysfunction, Improper body mechanics, Pain, Decreased range of motion, Decreased mobility, Decreased strength, Increased fascial restricitons, Increased muscle spasms, Impaired UE functional use, Decreased activity tolerance  Visit Diagnosis: Pain in right shoulder - Plan: PT plan of care cert/re-cert  Other symptoms and signs involving the musculoskeletal system - Plan: PT plan of care cert/re-cert  Abnormal posture - Plan: PT plan of care cert/re-cert     Problem List Patient Active Problem List   Diagnosis Date Noted  . Closed fracture of left distal radius 05/12/2016  . Impingement syndrome of right shoulder 05/07/2016  . Closed fracture of fifth metatarsal bone of right foot 04/07/2016  . Adjustment disorder with depressed mood 07/07/2012     Julie Schaefer Rober Minion PT, MPH  05/12/2016, 1:25 PM  [facility] [address] [address] [place], [place], [address] Phone: [phone]   Fax:  [phone]  Name: Julie Schaefer MRN: [id number] Date of Birth: May 30, 1971

## 2016-05-12 NOTE — Assessment & Plan Note (Signed)
2 days post closed reduction, doing well, continue splint, Dilaudid as needed for pain. Return to see me in 2 weeks, x-ray before visit.

## 2016-05-12 NOTE — Progress Notes (Signed)
  Subjective: Two days post closed reduction of the distal radius fracture, with some pain but increasing her dose of Dilantin has been somewhat helpful.  Objective: General: Well-developed, well-nourished, and in no acute distress. Left arm: Fingers are swollen and bruised as expected, sugar tong splint is in good shape.neurovascularly intact distally.  X-rays personally reviewed, there is good alignment without any further shift of the reduced distal radius fracture fragments.  Assessment/plan:

## 2016-05-14 ENCOUNTER — Encounter: Payer: Self-pay | Admitting: Sports Medicine

## 2016-05-16 ENCOUNTER — Encounter: Payer: Self-pay | Admitting: Sports Medicine

## 2016-05-18 ENCOUNTER — Encounter: Payer: Self-pay | Admitting: Sports Medicine

## 2016-05-21 ENCOUNTER — Encounter: Payer: Self-pay | Admitting: Sports Medicine

## 2016-05-21 ENCOUNTER — Ambulatory Visit (INDEPENDENT_AMBULATORY_CARE_PROVIDER_SITE_OTHER): Payer: BC Managed Care – PPO | Admitting: Physical Therapy

## 2016-05-21 ENCOUNTER — Ambulatory Visit (INDEPENDENT_AMBULATORY_CARE_PROVIDER_SITE_OTHER): Payer: BC Managed Care – PPO | Admitting: Sports Medicine

## 2016-05-21 ENCOUNTER — Ambulatory Visit (INDEPENDENT_AMBULATORY_CARE_PROVIDER_SITE_OTHER): Payer: BC Managed Care – PPO

## 2016-05-21 DIAGNOSIS — W19XXXD Unspecified fall, subsequent encounter: Secondary | ICD-10-CM

## 2016-05-21 DIAGNOSIS — R293 Abnormal posture: Secondary | ICD-10-CM | POA: Diagnosis not present

## 2016-05-21 DIAGNOSIS — S52502D Unspecified fracture of the lower end of left radius, subsequent encounter for closed fracture with routine healing: Secondary | ICD-10-CM | POA: Diagnosis not present

## 2016-05-21 DIAGNOSIS — S92351D Displaced fracture of fifth metatarsal bone, right foot, subsequent encounter for fracture with routine healing: Secondary | ICD-10-CM | POA: Diagnosis not present

## 2016-05-21 DIAGNOSIS — M25511 Pain in right shoulder: Secondary | ICD-10-CM

## 2016-05-21 DIAGNOSIS — R29898 Other symptoms and signs involving the musculoskeletal system: Secondary | ICD-10-CM

## 2016-05-21 NOTE — Assessment & Plan Note (Signed)
2 weeks post distal radius fracture closed reduction, transition into a cast, return to see me in 2 weeks, no further x-ray needed.

## 2016-05-21 NOTE — Therapy (Signed)
[facility] [place] [address] [place], [place], [address] Phone: [phone]   Fax:  [phone]  Physical Therapy Treatment  Patient Details  Name: Julie Schaefer MRN: [id number] Date of Birth: 18-Apr-1971 Referring Provider: Dr. Briant Sites  Encounter Date: 05/21/2016      PT End of Session - 05/21/16 1149    Visit Number 2   Number of Visits 12   Date for PT Re-Evaluation 06/19/16   PT Start Time 1105   PT Stop Time 1200   PT Time Calculation (min) 55 min   Activity Tolerance Patient tolerated treatment well      Past Medical History:  Diagnosis Date  . Carpal tunnel syndrome   . Depression   . Migraine   . Seasonal allergies   . Sleep apnea     Past Surgical History:  Procedure Laterality Date  . CARPAL TUNNEL RELEASE  2009  . HERNIA REPAIR  1993    There were no vitals filed for this visit.      Subjective Assessment - 05/21/16 1339    Subjective Pt presents cast on Lt forearm and Boot on Rt foot.  She has been released from using scooter for mobility.  She is still on pain meds for injuries; husband is driving her.    Currently in Pain? No/denies            [facility] PT Assessment - 05/21/16 0001      Assessment   Medical Diagnosis Rt shoulder impingement   Referring Provider Dr. Briant Sites   Onset Date/Surgical Date 11/27/15   Hand Dominance Right   Next MD Visit 05/21/16 (foot) 05/26/16 (wrist)    Prior Therapy None          [facility] Adult PT Treatment/Exercise - 05/21/16 0001      Self-Care   Self-Care Other Self-Care Comments   Other Self-Care Comments  pt instructed in self massage to pec and posterior shoulder.  Pt verbalized understanding.      Shoulder Exercises: Supine   Horizontal ABduction Strengthening;Right;10 reps;Theraband  2 sets   Theraband Level (Shoulder Horizontal ABduction) Level 1 (Yellow)   External Rotation Strengthening;Right;10 reps;Theraband  2 sets   Theraband  Level (Shoulder External Rotation) Level 1 (Yellow)   Flexion AROM;Both;10 reps  belt around both arms (AAROM) overhead pull.  2 sets   Other Supine Exercises RUE static stretch with arm hor abdct (off table) x 30 sec x 3 reps .    Other Supine Exercises on 1/2 foam roll:  snow angels x 10 reps      Modalities   Modalities Moist Heat     Moist Heat Therapy   Number Minutes Moist Heat 15 Minutes   Moist Heat Location Shoulder     Electrical Stimulation   Electrical Stimulation Location Rt posterior shoulder    Electrical Stimulation Action IFC   Electrical Stimulation Parameters to tolerance    Electrical Stimulation Goals Pain;Tone     Manual Therapy   Manual Therapy Soft tissue mobilization;Myofascial release   Manual therapy comments pt hooklying    Soft tissue mobilization TPR to Rt posterior rotator cuff   Myofascial Release to Rt pec                 PT Education - 05/21/16 1339    Education provided Yes   Education Details HEP    Person(s) Educated Patient   Methods Explanation;Handout   Comprehension Verbalized understanding;Returned demonstration  PT Long Term Goals - 05/12/16 1319      PT LONG TERM GOAL #1   Title Improve posture and alignment with patient to demonstrated improved upright cervical and shoulder postiion 06/19/16   Time 6   Period Weeks   Status New     PT LONG TERM GOAL #2   Title Improve strength through posterior shoulder girdle musculature with patient to maintain good scapular position for UE exercise and ADL's thus avoiding pain and impingement 06/22/16   Time 6   Period Weeks   Status New     PT LONG TERM GOAL #3   Title Increased AROM Rt shoulder by 10-15 degrees flex/abd/IR/ER 06/19/16   Time 6   Period Weeks   Status New     PT LONG TERM GOAL #4   Title Independent in HEP 06/19/16   Time 6   Period Weeks   Status New     PT LONG TERM GOAL #5   Title Improve FOTO to </= 30% limitation 06/22/16   Time 6    Period Weeks   Status New               Plan - 05/21/16 1341    Clinical Impression Statement Pt tolerated all exercises well; 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 She was tender with manual therapy to Rt posterior shoulder, reduced with use of MHP and estim at end of session.    Rehab Potential Good   PT Frequency 2x / week   PT Duration 6 weeks   PT Treatment/Interventions Patient/family education;ADLs/Self Care Home Management;Cryotherapy;Electrical Stimulation;Iontophoresis 4mg /ml Dexamethasone;Moist Heat;Ultrasound;Neuromuscular re-education;Manual techniques;Dry needling;Therapeutic activities;Therapeutic exercise   PT Next Visit Plan Assess response to HEP  and self-massage.  Progress posterior shoulder girdle exercise.  Manual vs TDN as indicated.    Consulted and Agree with Plan of Care Patient      Patient will benefit from skilled therapeutic intervention in order to improve the following deficits and impairments:  Postural dysfunction, Improper body mechanics, Pain, Decreased range of motion, Decreased mobility, Decreased strength, Increased fascial restricitons, Increased muscle spasms, Impaired UE functional use, Decreased activity tolerance  Visit Diagnosis: Pain in right shoulder  Other symptoms and signs involving the musculoskeletal system  Abnormal posture     Problem List Patient Active Problem List   Diagnosis Date Noted  . Closed fracture of left distal radius 05/12/2016  . Impingement syndrome of right shoulder 05/07/2016  . Closed fracture of fifth metatarsal bone of right foot 04/07/2016  . Adjustment disorder with depressed mood 07/07/2012   Mayer Camel, PTA 05/21/16 1:43 PM  [facility] [place] [address] [place], [place], [address] Phone: [phone]   Fax:  [phone]  Name: Julie Schaefer MRN: [id number] Date of Birth:  [birth year]

## 2016-05-21 NOTE — Progress Notes (Signed)
  Subjective: 6 weeks post right fifth metatarsal fracture, doing well.  2 weeks post closed reduction of left distal radius fracture, doing well   Objective: General: Well-developed, well-nourished, and in no acute distress. Left forearm: Splint is removed, swelling is minimal, still tender over the fracture, neurovascular intact distally. Short arm cast placed.  Assessment/plan:   Closed fracture of left distal radius 2 weeks post distal radius fracture closed reduction, transition into a cast, return to see me in 2 weeks, no further x-ray needed.  Closed fracture of fifth metatarsal bone of right foot Doing well 6 weeks out. May start gentle weightbearing. Will probably transition out of boot in 2 weeks,

## 2016-05-21 NOTE — Patient Instructions (Signed)
Low Row: Standing    Face anchor, feet shoulder width apart. Palms up, pull arms back, squeezing shoulder blades together. Repeat _10_ times per set. Do _2_ sets per session.   Strengthening: Resisted External Rotation    Hold tubing in right hand, elbow at side and forearm across body. Rotate forearm out. Repeat _10___ times per set. Do _2___ sets per session.   * Self massage to shoulder with ball.   * snow angel with arm off of bed for stretch.   [facility] [address] [place], [place] [address]  [phone] (office) [phone] (fax)

## 2016-05-21 NOTE — Assessment & Plan Note (Signed)
Doing well 6 weeks out. May start gentle weightbearing. Will probably transition out of boot in 2 weeks,

## 2016-05-26 ENCOUNTER — Encounter: Payer: Self-pay | Admitting: Rehabilitative and Restorative Service Providers"

## 2016-05-26 ENCOUNTER — Ambulatory Visit: Payer: Self-pay | Admitting: Sports Medicine

## 2016-05-26 ENCOUNTER — Telehealth: Payer: Self-pay

## 2016-05-26 DIAGNOSIS — S92351A Displaced fracture of fifth metatarsal bone, right foot, initial encounter for closed fracture: Secondary | ICD-10-CM

## 2016-05-26 MED ORDER — HYDROCODONE-ACETAMINOPHEN 5-325 MG PO TABS: 1.0000 | ORAL_TABLET | Freq: Three times a day (TID) | ORAL | 0 refills | Status: DC | PRN

## 2016-05-26 NOTE — Telephone Encounter (Signed)
Pt would like a refill of her Vicodin. Please advise.

## 2016-05-26 NOTE — Telephone Encounter (Signed)
Small amount given. This will be the last refill.

## 2016-05-26 NOTE — Telephone Encounter (Signed)
Left message

## 2016-06-01 ENCOUNTER — Ambulatory Visit (INDEPENDENT_AMBULATORY_CARE_PROVIDER_SITE_OTHER): Payer: BC Managed Care – PPO | Admitting: Physical Therapy

## 2016-06-01 DIAGNOSIS — R293 Abnormal posture: Secondary | ICD-10-CM

## 2016-06-01 DIAGNOSIS — M25511 Pain in right shoulder: Secondary | ICD-10-CM

## 2016-06-01 DIAGNOSIS — R29898 Other symptoms and signs involving the musculoskeletal system: Secondary | ICD-10-CM

## 2016-06-01 NOTE — Therapy (Signed)
[place], [place], [address] Phone: [phone]   Fax:  [phone]  Physical Therapy Treatment  Patient Details  Name: Julie Schaefer MRN: [id number] Date of Birth: 1971/09/18 Referring Provider: Dr. Helane Rima  Encounter Date: 06/01/2016      PT End of Session - 06/01/16 0936    Visit Number 3   Number of Visits 12   Date for PT Re-Evaluation 06/19/16   PT Start Time 0936   PT Stop Time 1028   PT Time Calculation (min) 52 min   Activity Tolerance Patient tolerated treatment well      Past Medical History:  Diagnosis Date  . Carpal tunnel syndrome   . Depression   . Migraine   . Seasonal allergies   . Sleep apnea     Past Surgical History:  Procedure Laterality Date  . CARPAL TUNNEL RELEASE  2009  . HERNIA REPAIR  1993    There were no vitals filed for this visit.      Subjective Assessment - 06/01/16 0940    Subjective Pt reports she feels like she is getting better, no problem with HEP    Currently in Pain? No/denies  only with certain movements, reaching over head, behind             [facility] PT Assessment - 06/01/16 0001      Assessment   Medical Diagnosis Rt shoulder impingement     AROM   Right Shoulder Flexion 159 Degrees   Right Shoulder ABduction 138 Degrees   Right Shoulder Internal Rotation 80 Degrees   Right Shoulder External Rotation 78 Degrees                     [facility] Adult PT Treatment/Exercise - 06/01/16 0001      Self-Care   Other Self-Care Comments  recommend she start doing some shoulder ex with Lt UE   increase HEP to use red band.      Shoulder Exercises: Seated   Other Seated Exercises 3x10 full can with 2#, scaption     Shoulder Exercises: Prone   Retraction Strengthening;Both;Weights  1# Rt, cast Lt, 3x10 T's , then Y's     Shoulder Exercises: Sidelying   External Rotation Strengthening;Right;Weights  3x10   External  Rotation Weight (lbs) 2sets with 3#, last with 2#    Other Sidelying Exercises empty can 3x10, 1#, in small ROM     Shoulder Exercises: Standing   Horizontal ABduction Right;Theraband  3x10   Theraband Level (Shoulder Horizontal ABduction) Level 2 (Red)   External Rotation Right;Strengthening  3x10 ball bouncing on wall red medicine ball.      Shoulder Exercises: Stretch   Other Shoulder Stretches 2-3 way doorway sitting at doorway      Modalities   Modalities Electrical Stimulation;Moist Heat     Moist Heat Therapy   Number Minutes Moist Heat 15 Minutes   Moist Heat Location Shoulder     Electrical Stimulation   Electrical Stimulation Location Rt shoulder   Electrical Stimulation Action IFC   Electrical Stimulation Parameters to tolerance   Electrical Stimulation Goals Tone                     PT Long Term Goals - 06/01/16 0951      PT LONG TERM GOAL #1   Title Improve posture and alignment with patient to demonstrated improved upright cervical and shoulder postiion 06/19/16   Period  Weeks   Status On-going     PT LONG TERM GOAL #2   Title Improve strength through posterior shoulder girdle musculature with patient to maintain good scapular position for UE exercise and ADL's thus avoiding pain and impingement 06/22/16   Time 6   Period Weeks   Status On-going     PT LONG TERM GOAL #3   Status Partially Met     PT LONG TERM GOAL #4   Title Independent in HEP 06/19/16   Time 6   Period Weeks   Status On-going     PT LONG TERM GOAL #5   Title Improve FOTO to </= 30% limitation 06/22/16   Time 6   Period Weeks   Status On-going               Plan - 06/01/16 1015    Clinical Impression Statement Brooklyne tolerated new exercises well.  Strongly encouraged her to begin doing some of th exercises with her Lt shoulder to limit risk of have issues on that side once she is out of the cast. Improved ROM , partially met this goal.     Rehab Potential Good    PT Frequency 2x / week   PT Duration 6 weeks   PT Treatment/Interventions Patient/family education;ADLs/Self Care Home Management;Cryotherapy;Electrical Stimulation;Iontophoresis 55m/ml Dexamethasone;Moist Heat;Ultrasound;Neuromuscular re-education;Manual techniques;Dry needling;Therapeutic activities;Therapeutic exercise   PT Next Visit Plan progress strength shoulder and upper back.    Consulted and Agree with Plan of Care Patient      Patient will benefit from skilled therapeutic intervention in order to improve the following deficits and impairments:  Postural dysfunction, Improper body mechanics, Pain, Decreased range of motion, Decreased mobility, Decreased strength, Increased fascial restricitons, Increased muscle spasms, Impaired UE functional use, Decreased activity tolerance  Visit Diagnosis: Other symptoms and signs involving the musculoskeletal system  Abnormal posture  Pain in right shoulder     Problem List Patient Active Problem List   Diagnosis Date Noted  . Closed fracture of left distal radius 05/12/2016  . Impingement syndrome of right shoulder 05/07/2016  . Closed fracture of fifth metatarsal bone of right foot 04/07/2016  . Adjustment disorder with depressed mood 07/07/2012    SJeral PinchPT  06/01/2016, 10:20 AM  [facility] [address]Phone: [phone]  Fax:  [phone] Name: Julie GuralMRN: [id number] of Birth: [birth year]

## 2016-06-03 ENCOUNTER — Encounter: Payer: Self-pay | Admitting: Sports Medicine

## 2016-06-04 ENCOUNTER — Encounter: Payer: Self-pay | Admitting: Sports Medicine

## 2016-06-04 ENCOUNTER — Ambulatory Visit (INDEPENDENT_AMBULATORY_CARE_PROVIDER_SITE_OTHER): Payer: BC Managed Care – PPO | Admitting: Sports Medicine

## 2016-06-04 ENCOUNTER — Ambulatory Visit (INDEPENDENT_AMBULATORY_CARE_PROVIDER_SITE_OTHER): Payer: BC Managed Care – PPO | Admitting: Physical Therapy

## 2016-06-04 DIAGNOSIS — S92351D Displaced fracture of fifth metatarsal bone, right foot, subsequent encounter for fracture with routine healing: Secondary | ICD-10-CM

## 2016-06-04 DIAGNOSIS — M25511 Pain in right shoulder: Secondary | ICD-10-CM

## 2016-06-04 DIAGNOSIS — R293 Abnormal posture: Secondary | ICD-10-CM | POA: Diagnosis not present

## 2016-06-04 DIAGNOSIS — S52502D Unspecified fracture of the lower end of left radius, subsequent encounter for closed fracture with routine healing: Secondary | ICD-10-CM

## 2016-06-04 DIAGNOSIS — R29898 Other symptoms and signs involving the musculoskeletal system: Secondary | ICD-10-CM

## 2016-06-04 NOTE — Assessment & Plan Note (Signed)
Discontinue boot and transition into a regular shoe, this is clinically healed.

## 2016-06-04 NOTE — Assessment & Plan Note (Signed)
4 weeks post closed reduction, well, cast was removed and she has a bit of pain from pressure over her dorsal hand but no longer tender over the fracture. Transition to a Velcro brace, precautions given, return to see me in 2 weeks. 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

## 2016-06-04 NOTE — Progress Notes (Signed)
  Subjective: Right fifth metatarsal: No longer painful, she is still in the boot.  Left distal radius fracture: 4 weeks post fracture reduction, doing well, has a little bit of pain over the dorsal hand in the cast.   Objective: General: Well-developed, well-nourished, and in no acute distress. Right Foot: No visible erythema or swelling. Range of motion is full in all directions. Strength is 5/5 in all directions. No hallux valgus. No pes cavus or pes planus. No abnormal callus noted. No pain over the navicular prominence, or base of fifth metatarsal. No tenderness to palpation of the calcaneal insertion of plantar fascia. No pain at the Achilles insertion. No pain over the calcaneal bursa. No pain of the retrocalcaneal bursa. No tenderness to palpation over the tarsals, metatarsals, or phalanges. No hallux rigidus or limitus. No tenderness palpation over interphalangeal joints. No pain with compression of the metatarsal heads. Neurovascularly intact distally. Left wrist: Cast is removed, no longer tender over the fracture, good motion of the wrist and fingers.  Assessment/plan:   No problem-specific Assessment & Plan notes found for this encounter.

## 2016-06-04 NOTE — Therapy (Signed)
[place], [place], [address] Phone: [phone]   Fax:  [phone]  Physical Therapy Treatment  Patient Details  Name: Julie Schaefer MRN: [id number] Date of Birth: June 27, 1971 Referring Provider: Dr. Helane Rima  Encounter Date: 06/04/2016      PT End of Session - 06/04/16 1147    Visit Number 4   Number of Visits 12   Date for PT Re-Evaluation 06/19/16   PT Start Time 1100   PT Stop Time 1200   PT Time Calculation (min) 60 min   Activity Tolerance Patient tolerated treatment well      Past Medical History:  Diagnosis Date  . Carpal tunnel syndrome   . Depression   . Migraine   . Seasonal allergies   . Sleep apnea     Past Surgical History:  Procedure Laterality Date  . CARPAL TUNNEL RELEASE  2009  . HERNIA REPAIR  1993    There were no vitals filed for this visit.      Subjective Assessment - 06/04/16 1105    Subjective Pt states she couldn't sleep last night due to pain in her Lt wrist.  Her fingers have been falling asleep. She is now in brace for Lt wrist and is to wean out of her Rt boot.  She is sore from exercises for shoulder two days ago.    Patient Stated Goals move any direction she wants without pain    Currently in Pain? Yes   Pain Score 4    Pain Location Wrist   Pain Orientation Right   Pain Descriptors / Indicators Patsi Sears Adult PT Treatment/Exercise - 06/04/16 0001      Shoulder Exercises: Supine   Horizontal ABduction Strengthening;Right;10 reps;Theraband   Theraband Level (Shoulder Horizontal ABduction) Level 2 (Red)  2 sets   Other Supine Exercises Rt shoulder SASH with red band (assist to hold band at side) x 10 reps x 2 sets   Other Supine Exercises Rt hand circles (back of hand) with Rt shoulder abdct and ER to 90 deg (CW/CCW x 15 each)     Shoulder Exercises: Sidelying   External Rotation Strengthening;Right;10 reps   External  Rotation Weight (lbs) 3#   Other Sidelying Exercises empty can 2x10, 1#, in small ROM     Shoulder Exercises: Standing   External Rotation Strengthening;Right;10 reps  dorsal surface of palm rolling ball on wall   Other Standing Exercises simulated conducting with green band as resistance (tied in front of pt, then behind pt) x 2 reps each side; VC for scap stabilization.      Shoulder Exercises: Therapy Ball   Flexion 20 reps  CW/ CCW with ball      Shoulder Exercises: Stretch   Other Shoulder Stretches 2 way doorway (low, mid) standing at doorway      Moist Heat Therapy   Number Minutes Moist Heat 15 Minutes   Moist Heat Location Shoulder     Electrical Stimulation   Electrical Stimulation Location Rt shoulder    Electrical Stimulation Action IFC   Electrical Stimulation Parameters  to tolerance    Electrical Stimulation Goals Pain;Tone     Manual Therapy   Manual Therapy Myofascial release;Soft tissue mobilization   Manual therapy comments pt hooklying    Soft tissue mobilization TPR to Rt infraspinatus and Rt pec maj/minor    Myofascial Release to Rt pec  PT Education - 06/04/16 1310    Education provided Yes   Education Details HEP- added resisted simulated conducting (green band issued for this)/ reminded pt to perform self massage to Rt shoulder with ball.    Person(s) Educated Patient   Methods Explanation   Comprehension Verbalized understanding             PT Long Term Goals - 06/01/16 0951      PT LONG TERM GOAL #1   Title Improve posture and alignment with patient to demonstrated improved upright cervical and shoulder postiion 06/19/16   Period Weeks   Status On-going     PT LONG TERM GOAL #2   Title Improve strength through posterior shoulder girdle musculature with patient to maintain good scapular position for UE exercise and ADL's thus avoiding pain and impingement 06/22/16   Time 6   Period Weeks   Status On-going      PT LONG TERM GOAL #3   Status Partially Met     PT LONG TERM GOAL #4   Title Independent in HEP 06/19/16   Time 6   Period Weeks   Status On-going     PT LONG TERM GOAL #5   Title Improve FOTO to </= 30% limitation 06/22/16   Time 6   Period Weeks   Status On-going               Plan - 06/04/16 1308    Clinical Impression Statement Julie Schaefer tolerated all exercises well, reporting fatigue after 2nd set of 10.  Pt reported some decrease in Rt arm pain with manual therapy and estim/MHP at end of session.  Progressing gradually towards goal.    Rehab Potential Good   PT Frequency 2x / week   PT Duration 6 weeks   PT Treatment/Interventions Patient/family education;ADLs/Self Care Home Management;Cryotherapy;Electrical Stimulation;Iontophoresis 45m/ml Dexamethasone;Moist Heat;Ultrasound;Neuromuscular re-education;Manual techniques;Dry needling;Therapeutic activities;Therapeutic exercise   PT Next Visit Plan progress strength shoulder and upper back as tolerated.        Patient will benefit from skilled therapeutic intervention in order to improve the following deficits and impairments:  Postural dysfunction, Improper body mechanics, Pain, Decreased range of motion, Decreased mobility, Decreased strength, Increased fascial restricitons, Increased muscle spasms, Impaired UE functional use, Decreased activity tolerance  Visit Diagnosis: Other symptoms and signs involving the musculoskeletal system  Abnormal posture  Pain in right shoulder     Problem List Patient Active Problem List   Diagnosis Date Noted  . Closed fracture of left distal radius 05/12/2016  . Impingement syndrome of right shoulder 05/07/2016  . Closed fracture of fifth metatarsal bone of right foot 04/07/2016  . Adjustment disorder with depressed mood 07/07/2012   JKerin Perna PTA 06/04/16 1:11 PM  [id number] [address]Phone: [phone]  Fax:  [phone] Name: Julie PaulsonMRN: [id number] of Birth: 412/28/1972

## 2016-06-05 ENCOUNTER — Encounter: Payer: Self-pay | Admitting: Sports Medicine

## 2016-06-05 DIAGNOSIS — S92901D Unspecified fracture of right foot, subsequent encounter for fracture with routine healing: Secondary | ICD-10-CM

## 2016-06-08 ENCOUNTER — Encounter: Payer: Self-pay | Admitting: Sports Medicine

## 2016-06-08 ENCOUNTER — Encounter: Payer: Self-pay | Admitting: Rehabilitative and Restorative Service Providers"

## 2016-06-08 ENCOUNTER — Ambulatory Visit (INDEPENDENT_AMBULATORY_CARE_PROVIDER_SITE_OTHER): Payer: BC Managed Care – PPO | Admitting: Rehabilitative and Restorative Service Providers"

## 2016-06-08 DIAGNOSIS — R29898 Other symptoms and signs involving the musculoskeletal system: Secondary | ICD-10-CM

## 2016-06-08 DIAGNOSIS — R293 Abnormal posture: Secondary | ICD-10-CM | POA: Diagnosis not present

## 2016-06-08 DIAGNOSIS — M25511 Pain in right shoulder: Secondary | ICD-10-CM | POA: Diagnosis not present

## 2016-06-08 NOTE — Therapy (Signed)
[facility] [address] [place], [place], [address] Phone: [phone]   Fax:  [phone]  Physical Therapy Treatment  Patient Details  Name: Julie LownHelen Hampshire MRN: [id number] Date of Birth: 03/24/1971 Referring Provider: Dr. Briant Siteshekkekandem  Encounter Date: 06/08/2016      PT End of Session - 06/08/16 0939    Visit Number 5   Number of Visits 12   Date for PT Re-Evaluation 06/19/16   PT Start Time 0936   PT Stop Time 1030   PT Time Calculation (min) 54 min   Activity Tolerance Patient tolerated treatment well      Past Medical History:  Diagnosis Date  . Carpal tunnel syndrome   . Depression   . Migraine   . Seasonal allergies   . Sleep apnea     Past Surgical History:  Procedure Laterality Date  . CARPAL TUNNEL RELEASE  2009  . HERNIA REPAIR  1993    There were no vitals filed for this visit.      Subjective Assessment - 06/08/16 0939    Subjective Patient reports thta she is weaning from the walking boot since Thursday and that is going well. Fingers have not fallen asleep since she got the cast off. Better in splint.    Currently in Pain? No/denies   Pain Location Shoulder   Pain Orientation Right   Pain Type Chronic pain   Pain Onset More than a month ago   Pain Frequency Intermittent                         [facility] Adult PT Treatment/Exercise - 06/08/16 0001      Shoulder Exercises: Supine   Horizontal ABduction Strengthening;Right;10 reps;Theraband   Theraband Level (Shoulder Horizontal ABduction) Level 2 (Red)  2 sets   External Rotation Strengthening;Right;10 reps;Theraband   Theraband Level (Shoulder External Rotation) Level 2 (Red)   Other Supine Exercises Rt shoulder SASH with red band (assist to hold band at side) x 10 reps x 2 sets   Other Supine Exercises Rt hand circles (back of hand) with Rt shoulder abdct and ER to 90 deg (CW/CCW x 15 each)with 2# weight      Shoulder  Exercises: Stretch   Other Shoulder Stretches 2 way doorway (low, mid) standing at doorway 30 sec x 3   modified mid level position facing doorway/gradual turn      Electrical Stimulation   Electrical Stimulation Location Rt shoulder bilat cervical    Electrical Stimulation Action IFC   Electrical Stimulation Parameters to tolerance   Electrical Stimulation Goals Pain;Tone     Manual Therapy   Manual Therapy Myofascial release;Soft tissue mobilization   Manual therapy comments pt hooklying    Soft tissue mobilization TPR to Rt infraspinatus and Rt pec maj/minor; cervical musculature    Myofascial Release to Rt pec    Passive ROM cervical flexion; flexion with rotation' lateral flexion both directions    Manual Traction traction through the UE - long arm                 PT Education - 06/08/16 1022    Education provided Yes   Education Details modification for pec stretch - longer band to secure for ex    Person(s) Educated Patient   Methods Explanation;Demonstration;Tactile cues;Verbal cues;Handout   Comprehension Verbalized understanding;Returned demonstration;Verbal cues required;Tactile cues required             PT Long Term  Goals - 06/08/16 0945      PT LONG TERM GOAL #1   Title Improve posture and alignment with patient to demonstrated improved upright cervical and shoulder postiion 06/19/16   Time 6   Period Weeks   Status On-going     PT LONG TERM GOAL #2   Title Improve strength through posterior shoulder girdle musculature with patient to maintain good scapular position for UE exercise and ADL's thus avoiding pain and impingement 06/22/16   Time 6   Period Weeks   Status On-going     PT LONG TERM GOAL #3   Title Increased AROM Rt shoulder by 10-15 degrees flex/abd/IR/ER 06/19/16   Time 6   Period Weeks   Status On-going     PT LONG TERM GOAL #4   Title Independent in HEP 06/19/16   Time 6   Period Weeks   Status On-going     PT LONG TERM GOAL  #5   Title Improve FOTO to </= 30% limitation 06/22/16   Time 6   Period Weeks   Status On-going               Plan - 06/08/16 1023    Clinical Impression Statement Continued gradual progress. Good progress with manual work with resolution of headache and improved tissue extensibility. Gradual progress continues. Doing well with weaning from the walking boots   Rehab Potential Good   PT Frequency 2x / week   PT Duration 6 weeks   PT Treatment/Interventions Patient/family education;ADLs/Self Care Home Management;Cryotherapy;Electrical Stimulation;Iontophoresis 4mg /ml Dexamethasone;Moist Heat;Ultrasound;Neuromuscular re-education;Manual techniques;Dry needling;Therapeutic activities;Therapeutic exercise   PT Next Visit Plan progress strength shoulder and upper back as tolerated.     Consulted and Agree with Plan of Care Patient      Patient will benefit from skilled therapeutic intervention in order to improve the following deficits and impairments:  Postural dysfunction, Improper body mechanics, Pain, Decreased range of motion, Decreased mobility, Decreased strength, Increased fascial restricitons, Increased muscle spasms, Impaired UE functional use, Decreased activity tolerance  Visit Diagnosis: Other symptoms and signs involving the musculoskeletal system  Abnormal posture  Pain in right shoulder     Problem List Patient Active Problem List   Diagnosis Date Noted  . Closed fracture of left distal radius 05/12/2016  . Impingement syndrome of right shoulder 05/07/2016  . Closed fracture of fifth metatarsal bone of right foot 04/07/2016  . Adjustment disorder with depressed mood 07/07/2012    Clelia Trabucco Rober MinionP Diavion Labrador PT,  MPH  06/08/2016, 10:25 AM  [facility] [address] [place], [place], [address] Phone: [phone]   Fax:  [phone]  Name: Julie LownHelen Dutil MRN: [id number] Date of Birth: 03/11/1971

## 2016-06-09 ENCOUNTER — Encounter: Payer: Self-pay | Admitting: Sports Medicine

## 2016-06-10 ENCOUNTER — Encounter: Payer: Self-pay | Admitting: Sports Medicine

## 2016-06-11 ENCOUNTER — Ambulatory Visit (INDEPENDENT_AMBULATORY_CARE_PROVIDER_SITE_OTHER): Payer: BC Managed Care – PPO | Admitting: Physical Therapy

## 2016-06-11 DIAGNOSIS — R29898 Other symptoms and signs involving the musculoskeletal system: Secondary | ICD-10-CM

## 2016-06-11 DIAGNOSIS — R293 Abnormal posture: Secondary | ICD-10-CM | POA: Diagnosis not present

## 2016-06-11 DIAGNOSIS — M25511 Pain in right shoulder: Secondary | ICD-10-CM

## 2016-06-11 NOTE — Therapy (Signed)
[facility] [address] [place], [place], [address] Phone: [phone]   Fax:  [phone]  Physical Therapy Treatment  Patient Details  Name: Julie Schaefer MRN: [id number] Date of Birth: 04/26/1971 Referring Provider: Dr. Briant Siteshekkekandem  Encounter Date: 06/11/2016      PT End of Session - 06/11/16 1620    Visit Number 6   Number of Visits 12   Date for PT Re-Evaluation 06/19/16   PT Start Time 1618   PT Stop Time 1721   PT Time Calculation (min) 63 min   Activity Tolerance Patient tolerated treatment well      Past Medical History:  Diagnosis Date  . Carpal tunnel syndrome   . Depression   . Migraine   . Seasonal allergies   . Sleep apnea     Past Surgical History:  Procedure Laterality Date  . CARPAL TUNNEL RELEASE  2009  . HERNIA REPAIR  1993    There were no vitals filed for this visit.      Subjective Assessment - 06/11/16 1620    Subjective Pt reports her shoulder is not bothering her today, but she has a significant headache today.     Currently in Pain? Yes   Pain Score 3    Pain Location Head   Pain Orientation Right   Pain Descriptors / Indicators Aching;Sharp            [facility] - 06/11/16 0001      AROM   Right Shoulder Flexion 160 Degrees  standing   Right Shoulder ABduction 150 Degrees   Right Shoulder External Rotation 78 Degrees                     [facility] Adult PT Treatment/Exercise - 06/11/16 0001      Shoulder Exercises: Standing   Horizontal ABduction Right;Theraband;15 reps   Theraband Level (Shoulder Horizontal ABduction) Level 2 (Red)   External Rotation Strengthening;Right;10 reps  dorsal surface of palm rolling ball on wall, 3 sets   Theraband Level (Shoulder External Rotation) --  Red band RUE x 10   Row Right;20 reps;Theraband   Theraband Level (Shoulder Row) Level 3 (Green)     Shoulder Exercises: Therapy Ball   Flexion 20 reps  CW/  CCW with ball    ABduction 20 reps  CW/CCW     Shoulder Exercises: ROM/Strengthening   UBE (Upper Arm Bike) L1 Rt arm only. 1 min forward, 1 min backward.      Shoulder Exercises: Stretch   Other Shoulder Stretches 2 way doorway (low, mid) standing at doorway 30 sec x 3   modified mid level position facing doorway/gradual turn      Moist Heat Therapy   Number Minutes Moist Heat 15 Minutes   Moist Heat Location Cervical;Shoulder     Electrical Stimulation   Electrical Stimulation Location Rt shoulder/ Rt upper trap and cervical paraspinals.    Electrical Stimulation Action IFC    Electrical Stimulation Parameters to tolerance    Electrical Stimulation Goals Tone;Pain     Manual Therapy   Manual therapy comments pt hooklying    Soft tissue mobilization TPR to Rt infraspinatus and Rt pec maj/minor; cervical musculature    Myofascial Release to Rt pec    Manual Traction traction through the UE - long arm.  cervical - held 30 sec x 3 reps.  PT Long Term Goals - 06/08/16 0945      PT LONG TERM GOAL #1   Title Improve posture and alignment with patient to demonstrated improved upright cervical and shoulder postiion 06/19/16   Time 6   Period Weeks   Status On-going     PT LONG TERM GOAL #2   Title Improve strength through posterior shoulder girdle musculature with patient to maintain good scapular position for UE exercise and ADL's thus avoiding pain and impingement 06/22/16   Time 6   Period Weeks   Status On-going     PT LONG TERM GOAL #3   Title Increased AROM Rt shoulder by 10-15 degrees flex/abd/IR/ER 06/19/16   Time 6   Period Weeks   Status On-going     PT LONG TERM GOAL #4   Title Independent in HEP 06/19/16   Time 6   Period Weeks   Status On-going     PT LONG TERM GOAL #5   Title Improve FOTO to </= 30% limitation 06/22/16   Time 6   Period Weeks   Status On-going           Plan - 06/11/16 1709    Clinical Impression  Statement Pt demonstrated improved Rt shoulder abduction ROM.  She has tolerated all exercises today without increase in pain.  Headache slightly reduced with manual therapy.  Progressing towards goals.    Rehab Potential Good   PT Frequency 2x / week   PT Duration 6 weeks   PT Next Visit Plan Assess Rt foot (new order from MD).  Continue progressive strengthening to Rt shoulder.    Consulted and Agree with Plan of Care Patient      Patient will benefit from skilled therapeutic intervention in order to improve the following deficits and impairments:  Postural dysfunction, Improper body mechanics, Pain, Decreased range of motion, Decreased mobility, Decreased strength, Increased fascial restricitons, Increased muscle spasms, Impaired UE functional use, Decreased activity tolerance  Visit Diagnosis: Other symptoms and signs involving the musculoskeletal system  Abnormal posture  Pain in right shoulder     Problem List Patient Active Problem List   Diagnosis Date Noted  . Closed fracture of left distal radius 05/12/2016  . Impingement syndrome of right shoulder 05/07/2016  . Closed fracture of fifth metatarsal bone of right foot 04/07/2016  . Adjustment disorder with depressed mood 07/07/2012   Mayer CamelJennifer Carlson-Long, PTA 06/11/16 5:15 PM  [facility] [address] [place], [place], [address] Phone: [phone]   Fax:  [phone]  Name: Julie Schaefer MRN: [id number] Date of Birth: 11/16/1970

## 2016-06-18 ENCOUNTER — Ambulatory Visit (INDEPENDENT_AMBULATORY_CARE_PROVIDER_SITE_OTHER): Payer: BC Managed Care – PPO | Admitting: Sports Medicine

## 2016-06-18 ENCOUNTER — Encounter: Payer: Self-pay | Admitting: Sports Medicine

## 2016-06-18 DIAGNOSIS — G5603 Carpal tunnel syndrome, bilateral upper limbs: Secondary | ICD-10-CM | POA: Diagnosis not present

## 2016-06-18 DIAGNOSIS — S52502D Unspecified fracture of the lower end of left radius, subsequent encounter for closed fracture with routine healing: Secondary | ICD-10-CM

## 2016-06-18 NOTE — Progress Notes (Signed)
  Subjective:    CC: Follow-up  HPI: Left distal radius fracture: 6 weeks post fracture, pain continues to improve in brace.  Carpal tunnel syndrome: Post carpal tunnel release on the right, with increasing symptoms after distal radius fracture on the left, agreeable to proceed with median nerve hydrodissection.  Past medical history, Surgical history, Family history not pertinant except as noted below, Social history, Allergies, and medications have been entered into the medical record, reviewed, and no changes needed.   Review of Systems: No fevers, chills, night sweats, weight loss, chest pain, or shortness of breath.   Objective:    General: Well Developed, well nourished, and in no acute distress.  Neuro: Alert and oriented x3, extra-ocular muscles intact, sensation grossly intact.  HEENT: Normocephalic, atraumatic, pupils equal round reactive to light, neck supple, no masses, no lymphadenopathy, thyroid nonpalpable.  Skin: Warm and dry, no rashes. Cardiac: Regular rate and rhythm, no murmurs rubs or gallops, no lower extremity edema.  Respiratory: Clear to auscultation bilaterally. Not using accessory muscles, speaking in full sentences.  Procedure: Real-time Ultrasound Guided Injection of left median nerve hydrodissection Device: GE Logiq E  Verbal informed consent obtained.  Time-out conducted.  Noted no overlying erythema, induration, or other signs of local infection.  Skin prepped in a sterile fashion.  Local anesthesia: Topical Ethyl chloride.  With sterile technique and under real time ultrasound guidance:  Using a total of 1 mL kenalog 40, 4 mL lidocaine injected medication both superficial to and deep to the median nerve freeing it from surrounding structures, the needle was then redirected deep into the carpal tunnel and further medication was injected around the flexor tendons. Completed without difficulty  Pain immediately resolved suggesting accurate placement of the  medication.  Advised to call if fevers/chills, erythema, induration, drainage, or persistent bleeding.  Images permanently stored and available for review in the ultrasound unit.  Impression: Technically successful ultrasound guided injection.  Impression and Recommendations:    Closed fracture of left distal radius Essentially healed, has some pain with motion due to prolonged immobilization, discontinue wrist splint.  Carpal tunnel syndrome, bilateral Left-sided median nerve hydrodissection, she is post traumatic carpal tunnel syndrome.  Return to see me in one month.  I spent 25 minutes with this patient, greater than 50% was face-to-face time counseling regarding the above diagnoses, this time was separate from time spent performing the procedure

## 2016-06-18 NOTE — Assessment & Plan Note (Signed)
Left-sided median nerve hydrodissection, she is post traumatic carpal tunnel syndrome.  Return to see me in one month.

## 2016-06-18 NOTE — Assessment & Plan Note (Signed)
Essentially healed, has some pain with motion due to prolonged immobilization, discontinue wrist splint.

## 2016-06-22 ENCOUNTER — Encounter: Payer: Self-pay | Admitting: Rehabilitative and Restorative Service Providers"

## 2016-06-22 ENCOUNTER — Ambulatory Visit (INDEPENDENT_AMBULATORY_CARE_PROVIDER_SITE_OTHER): Payer: BC Managed Care – PPO | Admitting: Rehabilitative and Restorative Service Providers"

## 2016-06-22 DIAGNOSIS — M79671 Pain in right foot: Secondary | ICD-10-CM

## 2016-06-22 DIAGNOSIS — R269 Unspecified abnormalities of gait and mobility: Secondary | ICD-10-CM

## 2016-06-22 DIAGNOSIS — R293 Abnormal posture: Secondary | ICD-10-CM

## 2016-06-22 DIAGNOSIS — R29898 Other symptoms and signs involving the musculoskeletal system: Secondary | ICD-10-CM | POA: Diagnosis not present

## 2016-06-22 DIAGNOSIS — M25511 Pain in right shoulder: Secondary | ICD-10-CM | POA: Diagnosis not present

## 2016-06-22 NOTE — Therapy (Addendum)
[facility] [place] [address] [place], [place], [address] Phone: [phone]   Fax:  [phone]  Physical Therapy Treatment  Patient Details  Name: Julie Schaefer MRN: [id number] Date of Birth: 03-16-1971 Referring Provider: Dr. Benjamin Stain   Encounter Date: 06/22/2016      PT End of Session - 06/22/16 1606    Visit Number 7   Number of Visits 12   Date for PT Re-Evaluation 06/19/16   PT Start Time 1602   PT Stop Time 1658   PT Time Calculation (min) 56 min   Activity Tolerance Patient tolerated treatment well      Past Medical History:  Diagnosis Date  . Carpal tunnel syndrome   . Depression   . Migraine   . Seasonal allergies   . Sleep apnea     Past Surgical History:  Procedure Laterality Date  . CARPAL TUNNEL RELEASE  2009  . HERNIA REPAIR  1993    There were no vitals filed for this visit.      Subjective Assessment - 06/22/16 1609    Subjective Patient reports that she turned ankle/foot while walking through the parking lot - sustained closed fracture of 5th metatarsal. Placed in a cast 04/07/16; removed 05/08/16 and pt placed in a walking boot but remained NWB for an additional 2 weeks. She removed walking boot 06/11/16. She is progressing with wt bearing but coninues to have pain with walking and standing.    Multiple Pain Sites Yes   Pain Score 3   Pain Location Foot   Pain Orientation Right   Pain Descriptors / Indicators Sharp   Pain Type Acute pain   Pain Onset More than a month ago   Pain Frequency Intermittent   Aggravating Factors  standing; walking   Pain Relieving Factors sititng; elevating foot             [facility] PT Assessment - 06/22/16 0001      Assessment   Medical Diagnosis Rt 5th metatarsal fx    Referring Provider Dr. Benjamin Stain    Onset Date/Surgical Date 04/06/16   Hand Dominance Right   Next MD Visit 9/17   Prior Therapy for shoulder      Precautions   Precautions  None     Restrictions   Other Position/Activity Restrictions Wt bearing as tolerated      Balance Screen   Has the patient fallen in the past 6 months Yes   How many times? 2   Has the patient had a decrease in activity level because of a fear of falling?  No   Is the patient reluctant to leave their home because of a fear of falling?  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[employer]    Leisure household chores; piano; knit/chrochett      Observation/Other Assessments   Focus on Therapeutic Outcomes (FOTO)  42% limitaition      Observation/Other Assessments-Edema    Edema --  mod edema noted Rt foot      Sensation   Additional Comments WFL's per pt report      Posture/Postural Control   Posture Comments shifts weight to Lt in standing decreased transverse and longtidinal arches bilat Rt > Lt  AROM   Right Ankle Dorsiflexion -5   Right Ankle Plantar Flexion 42   Right Ankle Inversion 24   Right Ankle Eversion 15   Left Ankle Dorsiflexion 1   Left Ankle Plantar Flexion 54   Left Ankle Inversion 34   Left Ankle Eversion 23     Strength   Right Ankle Dorsiflexion 4+/5   Right Ankle Plantar Flexion 3/5  NT with heel raise    Right Ankle Inversion 4+/5   Right Ankle Eversion 4/5     Flexibility   Soft Tissue Assessment /Muscle Length --  significant tightness through hip flexors Rt >> Lt    Hamstrings Rt 70 deg Lt 75 deg    Quadriceps tight Rt > Lt    ITB tight Rt    Piriformis tigth Rt      Palpation   Palpation comment Rt 5th metatarsal      Ambulation/Gait   Gait Comments ambulates with LE's in ER;  decreased wt bearing and wt shift to the Rt in stance on the Rt                      [facility] Adult PT Treatment/Exercise - 06/22/16 0001       Knee/Hip Exercises: Stretches   Passive Hamstring Stretch 3 reps;30 seconds   Quad Stretch 2 reps;30 seconds   Hip Flexor Stretch 3 reps;30 seconds   ITB Stretch 2 reps;30 seconds   Piriformis Stretch 2 reps;30 seconds  Rt w/ PT assist unable to stretch independently d/t Lt hand   Gastroc Stretch 3 reps;30 seconds     Moist Heat Therapy   Number Minutes Moist Heat 15 Minutes   Moist Heat Location Shoulder;Wrist;Hand  Rt shd/Lt wrist/hand      Cryotherapy   Number Minutes Cryotherapy 15 Minutes   Cryotherapy Location Ankle  Rt   Type of Cryotherapy Ice pack     Electrical Stimulation   Electrical Stimulation Location Rt lateral foot    Electrical Stimulation Action IFC   Electrical Stimulation Parameters  to tolerance   Electrical Stimulation Goals Pain;Edema     Ankle Exercises: Seated   Towel Crunch 5 reps  10-20 sec                 PT Education - 06/22/16 1650    Education provided Yes   Education Details HEP    Person(s) Educated Patient   Methods Explanation;Demonstration;Tactile cues;Verbal cues;Handout   Comprehension Verbalized understanding;Returned demonstration;Verbal cues required;Tactile cues required             PT Long Term Goals - 06/22/16 1705      PT LONG TERM GOAL #1   Title Improve posture and alignment with patient to demonstrated improved upright cervical and shoulder postiion 08/03/16/17   Time 12   Period Weeks   Status Revised     PT LONG TERM GOAL #2   Title Improve strength through posterior shoulder girdle musculature with patient to maintain good scapular position for UE exercise and ADL's thus avoiding pain and impingement 08/03/16   Time 12   Period Weeks   Status Revised     PT LONG TERM GOAL #3   Title Increased AROM Rt shoulder by 10-15 degrees flex/abd/IR/ER 08/03/16   Time 12   Period Weeks   Status Revised     PT LONG TERM GOAL #4   Title Independent in HEP 08/03/16/17   Time 12   Period Weeks   Status  Revised     PT LONG TERM GOAL #5   Title Improve FOTO to </= 30% limitation for Rt shoulder; 27% Rt ankle  08/03/16   Time 12   Period Weeks   Status Revised     Additional Long Term Goals   Additional Long Term Goals Yes     PT LONG TERM GOAL #6   Title Improve ankle ROM to WFL's and =/> Lt ankle 08/03/16   Time 6   Period Weeks   Status New     PT LONG TERM GOAL #7   Title Increase Rt ankle strength to 5-/5 to 5/5 08/03/16   Time 6   Period Weeks   Status New     PT LONG TERM GOAL #8   Title Ambulate with normal gait pattern with minimal to no pain 08/03/16   Time 6   Period Weeks   Status New               Plan - 06/22/16 1703    Clinical Impression Statement Patient presents for evaluation of Rt foot and ankle dysfunction followiing immobilization due to Fx Rt metatarsal. she has abnormal gait pattern; limited ROM Rt LE; weakness Rt ankle; pain with functional activities.    Rehab Potential Good   PT Frequency 2x / week   PT Duration 6 weeks   PT Treatment/Interventions Patient/family education;ADLs/Self Care Home Management;Cryotherapy;Electrical Stimulation;Iontophoresis 4mg /ml Dexamethasone;Moist Heat;Ultrasound;Neuromuscular re-education;Manual techniques;Dry needling;Therapeutic activities;Therapeutic exercise   PT Next Visit Plan Ankle rehab working on ROM and strength progressing to gait/balance/prioproception and higher level skills. Continue progressive strengthening to Rt shoulder.    Consulted and Agree with Plan of Care Patient      Patient will benefit from skilled therapeutic intervention in order to improve the following deficits and impairments:  Postural dysfunction, Improper body mechanics, Pain, Decreased range of motion, Decreased mobility, Decreased strength, Increased fascial restricitons, Increased muscle spasms, Impaired UE functional use, Decreased activity tolerance, Abnormal gait  Visit Diagnosis: Other symptoms and signs involving the  musculoskeletal system - Plan: PT plan of care cert/re-cert  Abnormal posture - Plan: PT plan of care cert/re-cert  Pain in right shoulder - Plan: PT plan of care cert/re-cert  Pain in right foot - Plan: PT plan of care cert/re-cert  Abnormal gait - Plan: PT plan of care cert/re-cert     Problem List Patient Active Problem List   Diagnosis Date Noted  . Carpal tunnel syndrome, bilateral 06/18/2016  . Closed fracture of left distal radius 05/12/2016  . Impingement syndrome of right shoulder 05/07/2016  . Closed fracture of fifth metatarsal bone of right foot 04/07/2016  . Adjustment disorder with depressed mood 07/07/2012    Celyn Rober MinionP Holt PT, MPH  06/22/2016, 5:16 PM  [facility] [address] [place], [place], [address] Phone: [phone]   Fax:  [phone]  Name: Boris LownHelen Miles MRN: [id number] Date of Birth: 02/15/1971

## 2016-06-22 NOTE — Patient Instructions (Addendum)
HIP: Hamstrings - Supine   Place strap around foot. Raise leg up, keeping knee straight.  Bend opposite knee to protect back if indicated. Hold 30 seconds. 3 reps per set, 2-3 sets per day     Outer Hip Stretch: Reclined IT Band Stretch (Strap)   Strap around one foot, pull leg across body until you feel a pull or stretch, with shoulders on mat. Hold for 30 seconds. Repeat 3 times each leg. 2-3 times/day.  Piriformis Stretch   Lying on back, pull right knee toward opposite shoulder. Hold 30 seconds. Repeat 3 times. Do 2-3 sessions per day.   Quads / HF, Supine   Lie near edge of bed, pull both knees up toward chest. Hold one knee as you drop the other leg off the edge of the bed.  Relax hanging knee/can bend knee back if indicated. Hold 30 seconds. Repeat 3 times per session. Do 2-3 sessions per day.    Quads / HF, Prone   Lie face down. Grasp one ankle with same-side hand. Use towel if needed to reach. Gently pull foot toward buttock.  Hold 30 seconds. Repeat 3 times per session. Do 2-3 sessions per day.   Achilles / Gastroc, Standing    Stand, right foot behind, heel on floor and turned slightly out, leg straight, forward leg bent. Move hips forward. Hold _30__ seconds. Repeat _3__ times per session. Do _2-3__ sessions per day.  Toe Curl: Unilateral    With right foot resting on towel, slowly bunch up towel by curling toes. Repeat _30___ times per set. Do __2-3__ sets per session. Do 1-2___ sessions per day.    Toe Curl: Bilateral    With both feet resting on towel, slowly bunch up towel by curling toes. Hold __30__ seconds. Repeat __2-3__ times per set. Do __1-2__ sets per session. Do __1-2_ sessions per day.   TENS UNIT: This is helpful for muscle pain and spasm.   Search and Purchase a TENS 7000 2nd edition at www.tenspros.com. It should be less than $30.     TENS unit instructions: Do not shower or bathe with the unit on Turn the unit off  before removing electrodes or batteries If the electrodes lose stickiness add a drop of water to the electrodes after they are disconnected from the unit and place on plastic sheet. If you continued to have difficulty, call the TENS unit company to purchase more electrodes. Do not apply lotion on the skin area prior to use. Make sure the skin is clean and dry as this will help prolong the life of the electrodes. After use, always check skin for unusual red areas, rash or other skin difficulties. If there are any skin problems, does not apply electrodes to the same area. Never remove the electrodes from the unit by pulling the wires. Do not use the TENS unit or electrodes other than as directed. Do not change electrode placement without consultating your therapist or physician. Keep 2 fingers with between each electrode.

## 2016-06-23 ENCOUNTER — Encounter: Payer: Self-pay | Admitting: Sports Medicine

## 2016-06-23 DIAGNOSIS — S52531D Colles' fracture of right radius, subsequent encounter for closed fracture with routine healing: Secondary | ICD-10-CM

## 2016-06-25 ENCOUNTER — Ambulatory Visit (INDEPENDENT_AMBULATORY_CARE_PROVIDER_SITE_OTHER): Payer: BC Managed Care – PPO | Admitting: Physical Therapy

## 2016-06-25 DIAGNOSIS — R29898 Other symptoms and signs involving the musculoskeletal system: Secondary | ICD-10-CM | POA: Diagnosis not present

## 2016-06-25 DIAGNOSIS — M79671 Pain in right foot: Secondary | ICD-10-CM

## 2016-06-25 DIAGNOSIS — M25511 Pain in right shoulder: Secondary | ICD-10-CM | POA: Diagnosis not present

## 2016-06-25 DIAGNOSIS — R269 Unspecified abnormalities of gait and mobility: Secondary | ICD-10-CM

## 2016-06-25 DIAGNOSIS — R293 Abnormal posture: Secondary | ICD-10-CM | POA: Diagnosis not present

## 2016-06-25 NOTE — Patient Instructions (Signed)
Inversion: Resisted   Cross legs with right leg underneath, foot in tubing loop. Hold tubing around other foot to resist and turn foot in. Repeat ___10_ times per set. Do __2__ sets per session. Do __3__ sessions per week.   Eversion: Resisted   With right foot in tubing loop, hold tubing around other foot to resist and turn foot out. Repeat _10___ times per set. Do __2__ sets per session. Do _3___ sessions per week.   http://[email or web address].exer.[email or web address]   Dorsiflexion: Resisted   Facing anchor, tubing around left foot, pull toward face.  Repeat _10___ times per set. Do _2___ sets per session. Do _3___ sessions per week.    [facility] [address] [place], [place] [address]  [phone]) [phone] (fax)

## 2016-06-25 NOTE — Therapy (Signed)
[facility] [place] [address] [place], [place], [address] Phone: [phone]   Fax:  [phone]  Physical Therapy Treatment  Patient Details  Name: Julie LownHelen Schaefer MRN: [id number] Date of Birth: 02/26/1971 Referring Provider: Dr. Briant Siteshekkekandem  Encounter Date: 06/25/2016      PT End of Session - 06/25/16 1622    Visit Number 8   Date for PT Re-Evaluation 08/03/16   PT Start Time 1615   PT Stop Time 1720   PT Time Calculation (min) 65 min   Activity Tolerance Patient tolerated treatment well      Past Medical History:  Diagnosis Date  . Carpal tunnel syndrome   . Depression   . Migraine   . Seasonal allergies   . Sleep apnea     Past Surgical History:  Procedure Laterality Date  . CARPAL TUNNEL RELEASE  2009  . HERNIA REPAIR  1993    There were no vitals filed for this visit.      Subjective Assessment - 06/25/16 1622    Subjective Pt reports no new changes since last visit. She continues to have pain in foot with walking and in Lt wrist with gripping.   Pertinent History Fx 5 metatarsal Rt foot 04/06/16 in walking boot; fx Lt radius fx closed reduction 05/08/16 in cast and sling; CT sx Rt wrist 2009    Currently in Pain? Yes   Pain Score 2    Pain Location Foot   Pain Orientation Right   Aggravating Factors  walking    Pain Relieving Factors sitting,elevation             [facility] PT Assessment - 06/25/16 0001      Assessment   Medical Diagnosis Rt 5th metatarsal fx    Referring Provider Dr. Briant Siteshekkekandem   Onset Date/Surgical Date 04/06/16   Hand Dominance Right   Next MD Visit 07/17/16                     [facility] - 06/25/16 0001      Knee/Hip Exercises: Stretches   Passive Hamstring Stretch Right;Left;2 reps;60 seconds   Quad Stretch 2 reps;30 seconds;Right;Left   ITB Stretch Right;Left;2 reps;30 seconds   Piriformis Stretch 2 reps;30 seconds  Rt w/ PT assist  unable to stretch independently d/t Lt hand   Gastroc Stretch Right;Left;3 reps;30 seconds     Knee/Hip Exercises: Aerobic   Nustep L4: 5 min ( RUE/ both legs)      Shoulder Exercises: Supine   Horizontal ABduction Strengthening;Right;10 reps;Theraband   Theraband Level (Shoulder Horizontal ABduction) Level 2 (Red)   External Rotation Strengthening;Right;10 reps;Theraband  2 sets    Theraband Level (Shoulder External Rotation) Level 2 (Red)   Other Supine Exercises Rt shoulder SASH with red band (assist to hold band at side) x 10 reps x 2 sets     Moist Heat Therapy   Number Minutes Moist Heat 15 Minutes   Moist Heat Location Shoulder     Electrical Stimulation   Electrical Stimulation Location Rt shoulder/ Rt upper trap    Electrical Stimulation Action IFC   Electrical Stimulation Parameters to tolerance    Electrical Stimulation Goals Pain     Manual Therapy   Manual Therapy Joint mobilization   Joint Mobilization Rt ankle talocrural  joint mobs Grade II-III, gentle calcaneal rock.    Passive ROM Rt ankle into DF, gentle mobs through rays  PT Education - 06/25/16 1716    Education provided Yes   Education Details HEP - ankle tband, yellow.    Person(s) Educated Patient   Methods Explanation;Handout;Demonstration   Comprehension Verbalized understanding;Returned demonstration             PT Long Term Goals - 06/25/16 1724      PT LONG TERM GOAL #1   Title Improve posture and alignment with patient to demonstrated improved upright cervical and shoulder postiion 08/03/16/17   Time 12   Period Weeks   Status On-going     PT LONG TERM GOAL #2   Title Improve strength through posterior shoulder girdle musculature with patient to maintain good scapular position for UE exercise and ADL's thus avoiding pain and impingement 08/03/16   Time 12   Period Weeks   Status On-going     PT LONG TERM GOAL #3   Title Increased AROM Rt shoulder by 10-15  degrees flex/abd/IR/ER 08/03/16   Time 12   Period Weeks   Status On-going     PT LONG TERM GOAL #4   Title Independent in HEP 08/03/16/17   Time 12   Period Weeks   Status On-going     PT LONG TERM GOAL #5   Title Improve FOTO to </= 30% limitation for Rt shoulder; 27% Rt ankle  08/03/16   Time 12   Period Weeks   Status On-going     PT LONG TERM GOAL #6   Title Improve Rt ankle ROM to WFL's and =/> Lt ankle 08/03/16   Time 6   Period Weeks   Status On-going     PT LONG TERM GOAL #7   Title Increase Rt ankle strength to 5-/5 to 5/5 08/03/16   Time 6   Period Weeks   Status On-going     PT LONG TERM GOAL #8   Title Ambulate with normal gait pattern with minimal to no pain 08/03/16   Time 6   Period Weeks   Status On-going               Plan - 06/25/16 1721    Clinical Impression Statement Pt tolerated Rt ankle resistance exercises without increase in symptoms. Pt did have some increased in Rt shoulder discomfort after tband exercises. This was decreased with use of MHP and estim at end of session.  Pt declined to use ice on Rt ankle at end of session; reports it increased her pain last session.  Overall progressing well towards unmet goals.    Rehab Potential Good   PT Frequency 2x / week   PT Duration 6 weeks   PT Treatment/Interventions Patient/family education;ADLs/Self Care Home Management;Cryotherapy;Electrical Stimulation;Iontophoresis 4mg /ml Dexamethasone;Moist Heat;Ultrasound;Neuromuscular re-education;Manual techniques;Dry needling;Therapeutic activities;Therapeutic exercise   PT Next Visit Plan Ankle rehab working on ROM and strength progressing to gait/balance/prioproception and higher level skills. Continue progressive strengthening to Rt shoulder.    Consulted and Agree with Plan of Care Patient      Patient will benefit from skilled therapeutic intervention in order to improve the following deficits and impairments:  Postural dysfunction, Improper body  mechanics, Pain, Decreased range of motion, Decreased mobility, Decreased strength, Increased fascial restricitons, Increased muscle spasms, Impaired UE functional use, Decreased activity tolerance, Abnormal gait  Visit Diagnosis: Other symptoms and signs involving the musculoskeletal system  Abnormal posture  Pain in right shoulder  Pain in right foot  Abnormal gait     Problem List Patient Active Problem List   Diagnosis Date  Noted  . Carpal tunnel syndrome, bilateral 06/18/2016  . Closed fracture of left distal radius 05/12/2016  . Impingement syndrome of right shoulder 05/07/2016  . Closed fracture of fifth metatarsal bone of right foot 04/07/2016  . Adjustment disorder with depressed mood 07/07/2012   Mayer Camel, PTA 06/25/16 5:37 PM  [facility] [place] [address] [place], [place], [address] Phone: [phone]   Fax:  [phone]  Name: Julie Schaefer MRN: [id number] Date of Birth: 1971-03-06

## 2016-07-01 ENCOUNTER — Ambulatory Visit (INDEPENDENT_AMBULATORY_CARE_PROVIDER_SITE_OTHER): Payer: BC Managed Care – PPO | Admitting: Rehabilitative and Restorative Service Providers"

## 2016-07-01 ENCOUNTER — Encounter: Payer: Self-pay | Admitting: Rehabilitative and Restorative Service Providers"

## 2016-07-01 DIAGNOSIS — M79671 Pain in right foot: Secondary | ICD-10-CM | POA: Diagnosis not present

## 2016-07-01 DIAGNOSIS — R293 Abnormal posture: Secondary | ICD-10-CM

## 2016-07-01 DIAGNOSIS — R29898 Other symptoms and signs involving the musculoskeletal system: Secondary | ICD-10-CM

## 2016-07-01 DIAGNOSIS — M25532 Pain in left wrist: Secondary | ICD-10-CM

## 2016-07-01 DIAGNOSIS — M25511 Pain in right shoulder: Secondary | ICD-10-CM

## 2016-07-01 DIAGNOSIS — R269 Unspecified abnormalities of gait and mobility: Secondary | ICD-10-CM

## 2016-07-02 NOTE — Therapy (Signed)
[facility] [place] [address] [place], [place], [address] Phone: [phone]   Fax:  [phone]  Physical Therapy Treatment  Patient Details  Name: Julie Schaefer MRN: [id number] Date of Birth: 09/18/1971 Referring Provider: Dr Benjamin Stain  Encounter Date: 07/01/2016      PT End of Session - 07/01/16 1556    Visit Number 9   Number of Visits 12   Date for PT Re-Evaluation 08/03/16   PT Start Time 1556   PT Stop Time 1653   PT Time Calculation (min) 57 min   Activity Tolerance Patient tolerated treatment well      Past Medical History:  Diagnosis Date  . Carpal tunnel syndrome   . Depression   . Migraine   . Seasonal allergies   . Sleep apnea     Past Surgical History:  Procedure Laterality Date  . CARPAL TUNNEL RELEASE  2009  . HERNIA REPAIR  1993    There were no vitals filed for this visit.      Subjective Assessment - 07/01/16 1604    Subjective Patient reports that she fell sustaining fracture of Lt wrist 05/08/16 with fracture reduced 05/10/16. She was placed in a cast for ~ 6 weeks with fracture splinted until             [facility] PT Assessment - 07/01/16 0001      Assessment   Medical Diagnosis Lt radius fx; Rt 5th metatarsal fx    Referring Provider Dr Benjamin Stain   Onset Date/Surgical Date 05/08/16   Hand Dominance Right   Next MD Visit 07/17/16   Prior Therapy for shoulder and foot      Balance Screen   Has the patient fallen in the past 6 months Yes   How many times? 2   Has the patient had a decrease in activity level because of a fear of falling?  No   Is the patient reluctant to leave their home because of a fear of falling?  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, [employer]    Leisure  household chores; piano; Merchandiser, retail Comments WFL's per pt report      AROM   Right/Left Elbow --  WFL's    Right Forearm Pronation 90 Degrees   Right Forearm Supination 76 Degrees   Left Forearm Pronation 85 Degrees   Left Forearm Supination 52 Degrees   Right Wrist Extension 76 Degrees   Right Wrist Flexion 75 Degrees   Right Wrist Radial Deviation 25 Degrees   Right Wrist Ulnar Deviation 48 Degrees   Left Wrist Extension 33 Degrees   Left Wrist Flexion 14 Degrees   Left Wrist Radial Deviation 11 Degrees   Left Wrist Ulnar Deviation 17 Degrees     Strength   Right/Left Elbow --  5/5 bilat    Right/Left Forearm --  5/5 Rt; 3/5 to 3+/5 Lt    Right/Left Wrist --  Rt 5/5 Lt ext 3+/5; flex 3/5 - painful on Lt    Right Hand Grip (lbs) 78   Right Hand Lateral Pinch 14 lbs   Right Hand 3 Point Pinch 8 lbs   Left Hand Grip (lbs) 20   Left Hand  Lateral Pinch 7 lbs   Left Hand 3 Point Pinch 4 lbs     Palpation   Palpation comment tenderness and tightness through the Lt wrist and hand; Lt thumb                      [facility] Adult PT Treatment/Exercise - 07/01/16 0001      Knee/Hip Exercises: Aerobic   Nustep L4: 10 min (bilat U/LE's)      Hand Exercises   MCPJ Flexion Left;10 reps   MCPJ Extension Left;5 reps   PIPJ Flexion 5 reps   PIPJ Extension 5 reps   DIPJ Flexion Left;5 reps   DIPJ Extension Left;5 reps   Opposition Left;5 reps   Thumb Opposition to little finger - to base of 5th finger      Wrist Exercises   Forearm Supination AAROM;Left  x3 assist with Rt UE   Forearm Pronation AAROM;Left  x3 assist with Rt UE    Wrist Flexion AAROM;Left;5 reps   Wrist Extension AAROM;Left   Wrist Radial Deviation AAROM;Left   Wrist Ulnar Deviation AAROM;Left   Other wrist exercises circles CW/CCW      Moist Heat Therapy   Number Minutes Moist Heat 10 Minutes   Moist Heat Location Shoulder;Wrist;Hand     Electrical Stimulation    Electrical Stimulation Location Lr dorsal/volar wrist/hand    Electrical Stimulation Action IFC   Electrical Stimulation Parameters to tolerance   Electrical Stimulation Goals Pain;Tone;Edema     Manual Therapy   Manual Therapy Joint mobilization   Joint Mobilization Lt MP/PIP/DIP's; Lt supination and pronation    Soft tissue mobilization Lt wrist/hand   Passive ROM Lt forearm; wrist; fingers      Ankle Exercises: Stretches   Gastroc Stretch 3 reps;30 seconds                PT Education - 07/01/16 1704    Education provided Yes   Education Details HEP wrist/hand   Person(s) Educated Patient   Methods Explanation;Demonstration;Tactile cues;Verbal cues   Comprehension Verbalized understanding;Returned demonstration;Verbal cues required;Tactile cues required             PT Long Term Goals - 07/01/16 1557      PT LONG TERM GOAL #1   Title Improve posture and alignment with patient to demonstrated improved upright cervical and shoulder postiion 08/03/16/17   Time 12   Period Weeks   Status On-going     PT LONG TERM GOAL #2   Title Improve strength through posterior shoulder girdle musculature with patient to maintain good scapular position for UE exercise and ADL's thus avoiding pain and impingement 08/03/16   Time 12   Period Weeks   Status On-going     PT LONG TERM GOAL #3   Title Increased AROM Rt shoulder by 10-15 degrees flex/abd/IR/ER 08/03/16   Time 12   Period Weeks   Status On-going     PT LONG TERM GOAL #4   Title Independent in HEP 08/03/16/17   Time 12   Period Weeks   Status On-going     PT LONG TERM GOAL #5   Title Improve FOTO to </= 30% limitation for Rt shoulder; 27% Rt ankle  08/03/16   Time 12   Period Weeks   Status On-going     Additional Long Term Goals   Additional Long Term Goals Yes     PT LONG TERM GOAL #6   Title Improve Rt ankle ROM to  WFL's and =/> Lt ankle 08/03/16   Time 6   Period Weeks   Status On-going     PT  LONG TERM GOAL #7   Title Increase Rt ankle strength to 5-/5 to 5/5 08/03/16   Time 6   Period Weeks   Status On-going     PT LONG TERM GOAL #8   Title Ambulate with normal gait pattern with minimal to no pain 08/03/16   Time 6   Period Weeks   Status On-going     PT LONG TERM GOAL  #9   TITLE Improve AROM Lt forearm, wrist and hand to [facility] 08/12/16   Time 6   Period Weeks   Status New     PT LONG TERM GOAL  #10   TITLE Improve strength Lt forearm, wrist and hand to allow patient to preform normal functional activities 08/12/16   Time 6   Period Weeks   Status New     PT LONG TERM GOAL  #11   TITLE Patient independent in ADL's Lt UE with minimal pain and limitation 08/12/16   Time 6   Period Weeks   Status New             Patient will benefit from skilled therapeutic intervention in order to improve the following deficits and impairments:     Visit Diagnosis: Other symptoms and signs involving the musculoskeletal system - Plan: PT plan of care cert/re-cert  Abnormal posture - Plan: PT plan of care cert/re-cert  Pain in right shoulder - Plan: PT plan of care cert/re-cert  Pain in right foot - Plan: PT plan of care cert/re-cert  Abnormal gait - Plan: PT plan of care cert/re-cert  Pain in left wrist - Plan: PT plan of care cert/re-cert     Problem List Patient Active Problem List   Diagnosis Date Noted  . Carpal tunnel syndrome, bilateral 06/18/2016  . Closed fracture of left distal radius 05/12/2016  . Impingement syndrome of right shoulder 05/07/2016  . Closed fracture of fifth metatarsal bone of right foot 04/07/2016  . Adjustment disorder with depressed mood 07/07/2012    Kieron Kantner Rober MinionP Kennedy Brines PT, MPH  07/02/2016, 7:14 AM  [facility] [address] [place], [place], [address] Phone: [phone]   Fax:  [phone]  Name: Julie Schaefer MRN: [id number] Date of Birth: 01/07/1971

## 2016-07-02 NOTE — Patient Instructions (Addendum)
AROM: Wrist Flexion    With right palm up, bend wrist up. Repeat _10___ times per set. Do _1___ sets per session. Do __2__ sessions per day.   AROM: Wrist Extension    With right palm down, bend wrist up. Repeat __   AROM: Wrist Radial Deviation    With right thumb up, bend wrist up. Repeat __10__ times per set. Do __1__ sets per session. Do __2__ sessions per day.   AROM: Wrist Ulnar Deviation    With right thumb down, bend wrist up. Repeat __10__ times per set. Do _1___ sets per session. Do __2__ sessions per day.   AROM: Wrist Flexion / Extension    Actively bend right wrist forward then back as far as possible. Repeat _10___ times per set. Do _1___ sets per session. Do _2___ sessions per day.   Wrist Flexor Stretch    Sitting with elbows on table and palms together, slowly lower wrists to table until stretch is felt. Keep palms together throughout the stretch. Hold __10__ seconds. Relax. Repeat _5___ times per set. Do ___1_ sets per session. Do __2__ sessions per day.    PROM: Finger MP Joints    Passively bend _each_______ finger of left hand at first row of knuckles until stretch is felt. Hold _10-15___ seconds. Relax. Straighten finger as far as possible. Repeat _3-5___ times per set. Do __1__ sets per session. Do _2___ sessions per day.   Finger Opposition    Actively touch right thumb to each fingertip. Start with index finger and proceed toward little finger. Move slowly at first, then more rapidly as motion and coordination improve. Be sure to touch each fingertip. Repeat _3-5___ times per set. Do _1___ sets per session. Do __2__ sessions per day.    AROM: Thumb Flexion / Extension    Actively bend right thumb across palm as far as possible. Hold ___1-2_ seconds. Relax. Then pull thumb back into hitchhike position. Repeat _5-10___ times per set. Do __1__ sets per session. Do __2__ sessions per day.    AROM: Finger Flexion /  Extension    Actively bend fingers of right hand. Start with knuckles furthest from palm, and slowly make a fist. Hold __3-5__ seconds. Relax. Then straighten fingers as far as possible. Repeat _3-5___ times per set. Do __1__ sets per session. Do __2__ sessions per day.

## 2016-07-09 ENCOUNTER — Ambulatory Visit (INDEPENDENT_AMBULATORY_CARE_PROVIDER_SITE_OTHER): Payer: BC Managed Care – PPO | Admitting: Physical Therapy

## 2016-07-09 DIAGNOSIS — R293 Abnormal posture: Secondary | ICD-10-CM

## 2016-07-09 DIAGNOSIS — R29898 Other symptoms and signs involving the musculoskeletal system: Secondary | ICD-10-CM | POA: Diagnosis not present

## 2016-07-09 DIAGNOSIS — M25511 Pain in right shoulder: Secondary | ICD-10-CM

## 2016-07-09 DIAGNOSIS — M79671 Pain in right foot: Secondary | ICD-10-CM | POA: Diagnosis not present

## 2016-07-09 DIAGNOSIS — M25532 Pain in left wrist: Secondary | ICD-10-CM

## 2016-07-09 DIAGNOSIS — R269 Unspecified abnormalities of gait and mobility: Secondary | ICD-10-CM

## 2016-07-09 NOTE — Therapy (Signed)
[place] [address] [place], [place], [address] Phone: [phone]   Fax:  [phone]  Physical Therapy Treatment  Patient Details  Name: Julie Schaefer MRN: [id number] Date of Birth: June 12, 1971 Referring Provider: Dr. Helane Rima   Encounter Date: 07/09/2016      PT End of Session - 07/09/16 1537    Visit Number 10   Number of Visits 12   Date for PT Re-Evaluation 08/03/16   PT Start Time 1534   PT Stop Time 1700   PT Time Calculation (min) 86 min   Activity Tolerance Patient tolerated treatment well      Past Medical History:  Diagnosis Date  . Carpal tunnel syndrome   . Depression   . Migraine   . Seasonal allergies   . Sleep apnea     Past Surgical History:  Procedure Laterality Date  . CARPAL TUNNEL RELEASE  2009  . HERNIA REPAIR  1993    There were no vitals filed for this visit.      Subjective Assessment - 07/09/16 1537    Subjective "My foot is being stupid", causing her to limp due to pain.  Lt wrist is 0/10 when in neutral, but certain positions (supination,flexion, ext).    Pertinent History Fx 5 metatarsal Rt foot 04/06/16 in walking boot; fx Lt radius fx closed reduction 05/08/16 in cast and sling; CT sx Rt wrist 2009    Currently in Pain? Yes   Pain Score 2    Pain Location Foot   Pain Orientation Right   Pain Descriptors / Indicators Aching;Sharp   Aggravating Factors  walking    Pain Relieving Factors elevation, sitting            [facility] PT Assessment - 07/09/16 0001      Assessment   Medical Diagnosis Lt radius fx; Rt 5th metatarsal fx    Referring Provider Dr. Helane Rima    Onset Date/Surgical Date 05/08/16   Hand Dominance Right   Next MD Visit 07/17/16     AROM   Left Wrist Extension 60 Degrees   Left Wrist Flexion 25 Degrees  33 after manual therapy   Left Wrist Radial Deviation 26 Degrees   Left Wrist Ulnar Deviation 21 Degrees           [facility] Adult PT  Treatment/Exercise - 07/09/16 0001      Exercises   Exercises Hand;Wrist;Ankle     Knee/Hip Exercises: Aerobic   Nustep L5: 7 min (bilat U/LE's)      Hand Exercises   MCPJ Flexion Left;10 reps   MCPJ Extension Left;5 reps   PIPJ Flexion 5 reps   PIPJ Extension 5 reps   DIPJ Flexion Left;5 reps   DIPJ Extension Left;5 reps   Opposition Left;5 reps     Wrist Exercises   Forearm Supination Left;10 reps;Seated;Bar weights/barbell   Bar Weights/Barbell (Forearm Supination) 1 lb   Forearm Pronation Left;10 reps;Seated;Bar weights/barbell   Bar Weights/Barbell (Forearm Pronation) 1 lb   Wrist Flexion Left;10 reps;AROM   Wrist Extension AROM;Left;Seated;20 reps;Strengthening  (10 without wt, 10 with weight   Bar Weights/Barbell (Wrist Extension) 1 lb   Wrist Radial Deviation Left;10 reps;Seated;Bar weights/barbell   Bar Weights/Barbell (Radial Deviation) 1 lb   Wrist Ulnar Deviation Left;10 reps;Seated;Bar weights/barbell   Bar Weights/Barbell (Ulnar Deviation) 1 lb     Modalities   Modalities Moist Heat;Ultrasound     Moist Heat Therapy   Number Minutes Moist Heat 12 Minutes  Moist Heat Location Ankle  Rt foot/ankle     Ultrasound   Ultrasound Location Rt wrist (med/lateral and dorsal surface)    Ultrasound Parameters 50%, 1.0 w/cm 2, 8 min    Ultrasound Goals Edema;Pain     Manual Therapy   Manual Therapy Myofascial release;Soft tissue mobilization;Taping;Passive ROM   Soft tissue mobilization Edge tool assistance to Lt wrist to forearm (palmar/dorsal surface) to decrease fascial restrictions, increase ROM and decrease pain/ sensitivity.    Myofascial Release pin and stretch to Lt wrist for wrist flex, ext, radial/ulnar deviation with active motion of hand/wrist.     Kinesiotex Edema;Create Space     Kinesiotix   Edema 3 web pieces placed prox to distal over Rt lateral and dorsal surface of forearm to wrist to decrease edema.    Create Space 2- I lift strips applied to  Rt fifth met head to decompress tissue and decrease pain.       Ankle Exercises: Stretches   Soleus Stretch 3 reps;30 seconds   Gastroc Stretch 3 reps;30 seconds     Ankle Exercises: Standing   SLS 3 trials of Rt SLS with occasional UE support (challenging on Rt compared to Lt)     Ankle Exercises: Seated   Towel Crunch 5 reps   Towel Inversion/Eversion 5 reps  each way with weight of 2 shoes     Ankle Exercises: Supine   T-Band Eversion with red band x 10 reps x 2 sets with RLE                PT Education - 07/09/16 1700    Education provided Yes   Education Details HEP    Person(s) Educated Patient   Methods Explanation;Handout   Comprehension Returned demonstration;Verbalized understanding             PT Long Term Goals - 07/01/16 1557      PT LONG TERM GOAL #1   Title Improve posture and alignment with patient to demonstrated improved upright cervical and shoulder postiion 08/03/16/17   Time 12   Period Weeks   Status On-going     PT LONG TERM GOAL #2   Title Improve strength through posterior shoulder girdle musculature with patient to maintain good scapular position for UE exercise and ADL's thus avoiding pain and impingement 08/03/16   Time 12   Period Weeks   Status On-going     PT LONG TERM GOAL #3   Title Increased AROM Rt shoulder by 10-15 degrees flex/abd/IR/ER 08/03/16   Time 12   Period Weeks   Status On-going     PT LONG TERM GOAL #4   Title Independent in HEP 08/03/16/17   Time 12   Period Weeks   Status On-going     PT LONG TERM GOAL #5   Title Improve FOTO to </= 30% limitation for Rt shoulder; 27% Rt ankle  08/03/16   Time 12   Period Weeks   Status On-going     Additional Long Term Goals   Additional Long Term Goals Yes     PT LONG TERM GOAL #6   Title Improve Rt ankle ROM to WFL's and =/> Lt ankle 08/03/16   Time 6   Period Weeks   Status On-going     PT LONG TERM GOAL #7   Title Increase Rt ankle strength to 5-/5 to  5/5 08/03/16   Time 6   Period Weeks   Status On-going     PT LONG TERM  GOAL #8   Title Ambulate with normal gait pattern with minimal to no pain 08/03/16   Time 6   Period Weeks   Status On-going     PT LONG TERM GOAL  #9   TITLE Improve AROM Lt forearm, wrist and hand to [facility] 08/12/16   Time 6   Period Weeks   Status New     PT LONG TERM GOAL  #10   TITLE Improve strength Lt forearm, wrist and hand to allow patient to preform normal functional activities 08/12/16   Time 6   Period Weeks   Status New     PT LONG TERM GOAL  #11   TITLE Patient independent in ADL's Lt UE with minimal pain and limitation 08/12/16   Time 6   Period Weeks   Status New               Plan - 07/09/16 1706    Clinical Impression Statement Pt demonstrated improved Lt wrist ROM after exercise and manual therapy.  She tolerated all exercises with little to no increase in pain.  Making gradual progress towards goals.    Rehab Potential Good   PT Frequency 2x / week   PT Duration 6 weeks   PT Treatment/Interventions Patient/family education;ADLs/Self Care Home Management;Cryotherapy;Electrical Stimulation;Iontophoresis 16m/ml Dexamethasone;Moist Heat;Ultrasound;Neuromuscular re-education;Manual techniques;Dry needling;Therapeutic activities;Therapeutic exercise   PT Next Visit Plan Assess response to Rock tape at foot and Lt forearm.  Continue progressive ankle  ROM and strengthening.  progressing to gait/balance/prioproception and higher level skills. Continue progressive strengthening to Rt shoulder.    Consulted and Agree with Plan of Care Patient      Patient will benefit from skilled therapeutic intervention in order to improve the following deficits and impairments:  Postural dysfunction, Improper body mechanics, Pain, Decreased range of motion, Decreased mobility, Decreased strength, Increased fascial restricitons, Increased muscle spasms, Impaired UE functional use, Decreased activity  tolerance, Abnormal gait  Visit Diagnosis: No diagnosis found.     Problem List Patient Active Problem List   Diagnosis Date Noted  . Carpal tunnel syndrome, bilateral 06/18/2016  . Closed fracture of left distal radius 05/12/2016  . Impingement syndrome of right shoulder 05/07/2016  . Closed fracture of fifth metatarsal bone of right foot 04/07/2016  . Adjustment disorder with depressed mood 07/07/2012   JKerin Perna PTA 07/09/16 5:11 PM  [place] [address]Phone: [phone]  Fax:  [phone] Name: Julie SalemMRN: [id number] of Birth: [birth year]

## 2016-07-09 NOTE — Patient Instructions (Signed)
Things to work on:   * Single leg balance on Right leg.  Goal of 15-30 seconds (without touching hand to counter) * Band exercise for Right ankle ( inversion, eversion, dorsi flexion - toes in, toes out, toes up) x 2 sets of 10 each.  * calf stretch, bent knee and straight knee.  Held 30 sec, 3 reps.  * towel crunches - front, slide to Right, slide to Left)  Flexion / Extension (Resistive)    Wring a towel, using wrist movements only. Do not move other hand. Flexion: Twist downward. Extension: Twist upward. Repeat __20__ times. Do ___1-2_ sessions per day.  Forearm Supination / Pronation: Resisted    With ___1_ pound object in right hand, slowly turn palm up, then down. Repeat __10__ times per set. Do __2_ sets per session. Do __1__ sessions per day.  [facility] [place] [address] [place], [place] [address]  [phone] (office) [phone] (fax)

## 2016-07-12 ENCOUNTER — Encounter: Payer: Self-pay | Admitting: Sports Medicine

## 2016-07-12 DIAGNOSIS — S52502D Unspecified fracture of the lower end of left radius, subsequent encounter for closed fracture with routine healing: Secondary | ICD-10-CM

## 2016-07-16 ENCOUNTER — Ambulatory Visit (INDEPENDENT_AMBULATORY_CARE_PROVIDER_SITE_OTHER): Payer: BC Managed Care – PPO | Admitting: Physical Therapy

## 2016-07-16 DIAGNOSIS — M79671 Pain in right foot: Secondary | ICD-10-CM

## 2016-07-16 DIAGNOSIS — R29898 Other symptoms and signs involving the musculoskeletal system: Secondary | ICD-10-CM

## 2016-07-16 DIAGNOSIS — R293 Abnormal posture: Secondary | ICD-10-CM | POA: Diagnosis not present

## 2016-07-16 DIAGNOSIS — M25532 Pain in left wrist: Secondary | ICD-10-CM

## 2016-07-16 NOTE — Therapy (Signed)
[place], [place], [address] Phone: [phone]   Fax:  [phone]  Physical Therapy Treatment  Patient Details  Name: Julie Schaefer MRN: [id number] Date of Birth: 1971/03/03 Referring Provider: Dr. Helane Rima  Encounter Date: 07/16/2016      PT End of Session - 07/16/16 1623    Visit Number 11   Number of Visits 12   Date for PT Re-Evaluation 08/03/16   PT Start Time 1618   PT Stop Time 1735   PT Time Calculation (min) 77 min   Activity Tolerance Patient tolerated treatment well;No increased pain   Behavior During Therapy WFL for tasks assessed/performed      Past Medical History:  Diagnosis Date  . Carpal tunnel syndrome   . Depression   . Migraine   . Seasonal allergies   . Sleep apnea     Past Surgical History:  Procedure Laterality Date  . CARPAL TUNNEL RELEASE  2009  . HERNIA REPAIR  1993    There were no vitals filed for this visit.      Subjective Assessment - 07/16/16 1623    Subjective Pt feels the Rock tape has helped decrease pain in Rt foot and helped increase proprioception in her Lt forearm.  Rt shoulder no longer problematic.   She has been completing HEP for wrist and foot every other day.    Pertinent History Fx 5 metatarsal Rt foot 04/06/16 in walking boot; fx Lt radius fx closed reduction 05/08/16 in cast and sling; CT sx Rt wrist 2009    Patient Stated Goals move any direction she wants without pain and use Lt UE for what she wants   Currently in Pain? Yes   Pain Score 3    Pain Location Foot   Pain Orientation Right   Pain Descriptors / Indicators Dull   Aggravating Factors  weight bearing / walking   Pain Relieving Factors sitting, elevation            [facility] PT Assessment - 07/16/16 0001      Assessment   Medical Diagnosis Lt radius fx; Rt 5th metatarsal fx    Referring Provider Dr. Helane Rima   Onset Date/Surgical Date 05/08/16   Hand  Dominance Right   Next MD Visit 07/17/16     Observation/Other Assessments   Focus on Therapeutic Outcomes (FOTO)  21% shoulder, 17% foot      ROM / Strength   AROM / PROM / Strength AROM     AROM   AROM Assessment Site Forearm;Wrist   Right Shoulder Flexion 160 Degrees   Right Shoulder ABduction 155 Degrees   Right Shoulder External Rotation 90 Degrees   Left Forearm Pronation 85 Degrees   Left Forearm Supination 82 Degrees   Left Wrist Extension 64 Degrees   Left Wrist Flexion 33 Degrees   Left Wrist Radial Deviation 26 Degrees   Left Wrist Ulnar Deviation 21 Degrees   Right Ankle Dorsiflexion 3   Right Ankle Plantar Flexion 52   Right Ankle Inversion 33   Right Ankle Eversion 26          [facility] Adult PT Treatment/Exercise - 07/16/16 0001      Knee/Hip Exercises: Aerobic   Nustep L4: 6 min      Wrist Exercises   Bar Weights/Barbell (Forearm Supination) 1 lb   Bar Weights/Barbell (Forearm Pronation) 1 lb   Wrist Radial Deviation Left;10 reps;Seated;Bar weights/barbell   Bar Weights/Barbell (Radial Deviation) 1 lb  Wrist Ulnar Deviation Left;10 reps;Seated;Bar weights/barbell   Bar Weights/Barbell (Ulnar Deviation) 1 lb     Ultrasound   Ultrasound Location Rt fifth metatarsal head;  Rt wrist medial/ lateral forearm    Ultrasound Parameters 50%, 1.0 w/cm2, 12 min total    Ultrasound Goals Edema;Pain     Manual Therapy   Manual Therapy Myofascial release;Soft tissue mobilization;Taping;Passive ROM   Soft tissue mobilization Edge tool assistance to Lt wrist to forearm (palmar/dorsal surface) to decrease fascial restrictions, increase ROM and decrease pain/ sensitivity.    Myofascial Release pin and stretch to Lt wrist for wrist flex, ext, radial/ulnar deviation with active motion of hand/wrist.     Kinesiotex Edema;Create Space     Kinesiotix   Edema 3 web pieces placed prox to distal over Rt lateral and dorsal surface of forearm to wrist to decrease edema.    Create  Space 2- I lift strips applied to Rt fifth met head to decompress tissue and decrease pain.       Ankle Exercises: Standing   SLS Rt SLS on blue pad with occasional UE support x 3 trials.      Ankle Exercises: Stretches   Soleus Stretch 3 reps;30 seconds   Gastroc Stretch 3 reps;30 seconds     Ankle Exercises: Supine   T-Band Eversion, inversion, DF with red band x 10 reps x 2 sets with RLE                     PT Long Term Goals - 07/16/16 1712      PT LONG TERM GOAL #1   Title Improve posture and alignment with patient to demonstrated improved upright cervical and shoulder postiion 08/03/16/17   Time 12   Period Weeks   Status Achieved     PT LONG TERM GOAL #2   Title Improve strength through posterior shoulder girdle musculature with patient to maintain good scapular position for UE exercise and ADL's thus avoiding pain and impingement 08/03/16   Time 12   Period Weeks   Status Achieved     PT LONG TERM GOAL #3   Title Increased AROM Rt shoulder by 10-15 degrees flex/abd/IR/ER 08/03/16   Time 12   Period Weeks   Status Achieved     PT LONG TERM GOAL #4   Title Independent in HEP 08/03/16/17   Time 12   Period Weeks   Status On-going     PT LONG TERM GOAL #5   Title Improve FOTO to </= 30% limitation for Rt shoulder; 27% Rt ankle  08/03/16   Time 12   Period Weeks   Status Achieved     PT LONG TERM GOAL #6   Title Improve Rt ankle ROM to WFL's and =/> Lt ankle 08/03/16   Time 6   Period Weeks   Status On-going     PT LONG TERM GOAL #7   Title Increase Rt ankle strength to 5-/5 to 5/5 08/03/16   Time 6   Period Weeks   Status On-going     PT LONG TERM GOAL #8   Title Ambulate with normal gait pattern with minimal to no pain 08/03/16   Time 6   Period Weeks   Status On-going     PT LONG TERM GOAL  #9   TITLE Improve AROM Lt forearm, wrist and hand to [facility] 08/12/16   Time 6   Period Weeks   Status On-going     PT LONG TERM GOAL  #  10   TITLE  Improve strength Lt forearm, wrist and hand to allow patient to preform normal functional activities 08/12/16   Time 6   Period Weeks   Status On-going     PT LONG TERM GOAL  #11   TITLE Patient independent in ADL's Lt UE with minimal pain and limitation 08/12/16   Time 6   Period Weeks   Status On-going               Plan - 07/16/16 1741    Clinical Impression Statement Pt demonstrated improved Lt wrist ROM and Rt shoulder ROM.  She tolerated all ankle and wrist exercises without increase pain.  Pt had positive response to Rock tape application to Rt foot and Lt forearm; retaped today.  Pt has met LTG # 1,2,3 and 5 and is making good gains towards unmet goals.      Rehab Potential Good   PT Frequency 2x / week   PT Duration 6 weeks   PT Treatment/Interventions Patient/family education;ADLs/Self Care Home Management;Cryotherapy;Electrical Stimulation;Iontophoresis 38m/ml Dexamethasone;Moist Heat;Ultrasound;Neuromuscular re-education;Manual techniques;Dry needling;Therapeutic activities;Therapeutic exercise   PT Next Visit Plan Progress balance/prioproception and higher level skills for Rt ankle. Continue progressive ROM/ strengthening to Lt wrist/forearm.     Consulted and Agree with Plan of Care Patient      Patient will benefit from skilled therapeutic intervention in order to improve the following deficits and impairments:  Postural dysfunction, Improper body mechanics, Pain, Decreased range of motion, Decreased mobility, Decreased strength, Increased fascial restricitons, Increased muscle spasms, Impaired UE functional use, Decreased activity tolerance, Abnormal gait  Visit Diagnosis: Other symptoms and signs involving the musculoskeletal system  Abnormal posture  Pain in right foot  Pain in left wrist     Problem List Patient Active Problem List   Diagnosis Date Noted  . Carpal tunnel syndrome, bilateral 06/18/2016  . Closed fracture of left distal radius  05/12/2016  . Impingement syndrome of right shoulder 05/07/2016  . Closed fracture of fifth metatarsal bone of right foot 04/07/2016  . Adjustment disorder with depressed mood 07/07/2012   JKerin Perna PTA 07/16/16 5:50 PM  [place] [place] [place] [address]Phone: [phone]  Fax:  [phone] Name: HLakaya TolenMRN: [id number] of Birth: [birth year]

## 2016-07-17 ENCOUNTER — Ambulatory Visit (INDEPENDENT_AMBULATORY_CARE_PROVIDER_SITE_OTHER): Payer: BC Managed Care – PPO | Admitting: Sports Medicine

## 2016-07-17 ENCOUNTER — Ambulatory Visit (INDEPENDENT_AMBULATORY_CARE_PROVIDER_SITE_OTHER): Payer: BC Managed Care – PPO

## 2016-07-17 ENCOUNTER — Encounter: Payer: Self-pay | Admitting: Sports Medicine

## 2016-07-17 DIAGNOSIS — S52502D Unspecified fracture of the lower end of left radius, subsequent encounter for closed fracture with routine healing: Secondary | ICD-10-CM

## 2016-07-17 DIAGNOSIS — W19XXXD Unspecified fall, subsequent encounter: Secondary | ICD-10-CM

## 2016-07-17 DIAGNOSIS — G5603 Carpal tunnel syndrome, bilateral upper limbs: Secondary | ICD-10-CM

## 2016-07-17 DIAGNOSIS — S52502G Unspecified fracture of the lower end of left radius, subsequent encounter for closed fracture with delayed healing: Secondary | ICD-10-CM

## 2016-07-17 NOTE — Assessment & Plan Note (Signed)
Symptoms are resolved after left median nerve hydrodissection.

## 2016-07-17 NOTE — Assessment & Plan Note (Signed)
Good bony callus visible, the fracture is not healed yet. She does have some lack of range of motion to flexion and Still a bit of discomfort. There also has been a bit of shortening of the distal radius after fracture, we will keep an eye out for ulnar abutment syndrome.

## 2016-07-17 NOTE — Progress Notes (Signed)
  Subjective:    CC: Follow-up  HPI: Carpal tunnel syndrome: Resolved after hydrodissection on the left.  Distal radius fracture: Still with some discomfort, working with physical therapy and symptoms are improving, she still has some difficulty playing the p.m.  Past medical history:  Negative.  See flowsheet/record as well for more information.  Surgical history: Negative.  See flowsheet/record as well for more information.  Family history: Negative.  See flowsheet/record as well for more information.  Social history: Negative.  See flowsheet/record as well for more information.  Allergies, and medications have been entered into the medical record, reviewed, and no changes needed.   Review of Systems: No fevers, chills, night sweats, weight loss, chest pain, or shortness of breath.   Objective:    General: Well Developed, well nourished, and in no acute distress.  Neuro: Alert and oriented x3, extra-ocular muscles intact, sensation grossly intact.  HEENT: Normocephalic, atraumatic, pupils equal round reactive to light, neck supple, no masses, no lymphadenopathy, thyroid nonpalpable.  Skin: Warm and dry, no rashes. Cardiac: Regular rate and rhythm, no murmurs rubs or gallops, no lower extremity edema.  Respiratory: Clear to auscultation bilaterally. Not using accessory muscles, speaking in full sentences. Wrists: Ulnar deviation is somewhat less on the left side compared to the right.  X-rays reviewed and show good bony callus, there is some shortening of the radial height with a positive ulnar variance.  Impression and Recommendations:    Carpal tunnel syndrome, bilateral Symptoms are resolved after left median nerve hydrodissection.  Closed fracture of left distal radius Good bony callus visible, the fracture is not healed yet. She does have some lack of range of motion to flexion and Still a bit of discomfort. There also has been a bit of shortening of the distal radius  after fracture, we will keep an eye out for ulnar abutment syndrome.

## 2016-07-22 ENCOUNTER — Encounter: Payer: Self-pay | Admitting: Sports Medicine

## 2016-07-23 ENCOUNTER — Encounter: Payer: Self-pay | Admitting: Physical Therapy

## 2016-07-24 ENCOUNTER — Ambulatory Visit (INDEPENDENT_AMBULATORY_CARE_PROVIDER_SITE_OTHER): Payer: BC Managed Care – PPO | Admitting: Physical Therapy

## 2016-07-24 DIAGNOSIS — M25532 Pain in left wrist: Secondary | ICD-10-CM

## 2016-07-24 DIAGNOSIS — R293 Abnormal posture: Secondary | ICD-10-CM | POA: Diagnosis not present

## 2016-07-24 DIAGNOSIS — R29898 Other symptoms and signs involving the musculoskeletal system: Secondary | ICD-10-CM

## 2016-07-24 NOTE — Therapy (Signed)
[place]  [place], [place], [address] Phone: [phone]   Fax:  [phone]  Physical Therapy Treatment  Patient Details  Name: Julie Schaefer MRN: [id number] Date of Birth: 10-19-1971 Referring Provider: Dr. Helane Rima  Encounter Date: 07/24/2016      PT End of Session - 07/24/16 0809    Visit Number 12   Number of Visits 24   Date for PT Re-Evaluation 09/04/16   PT Start Time 0804   PT Stop Time 0850   PT Time Calculation (min) 46 min      Past Medical History:  Diagnosis Date  . Carpal tunnel syndrome   . Depression   . Migraine   . Seasonal allergies   . Sleep apnea     Past Surgical History:  Procedure Laterality Date  . CARPAL TUNNEL RELEASE  2009  . HERNIA REPAIR  1993    There were no vitals filed for this visit.      Subjective Assessment - 07/24/16 0809    Subjective Pt reports her foot has been feeling pretty good.  She would prefer to focus on her Lt forearm and wrist today.  Her Rt shoulder is no longer troublesome.  Has had xray of her wrist on 07/17/16 (see chart).  She has had positive results wiht Rock tape (less pain, swelling)   Pertinent History Fx 5 metatarsal Rt foot 04/06/16 in walking boot; fx Lt radius fx closed reduction 05/08/16 in cast and sling; CT sx Rt wrist 2009    Patient Stated Goals move any direction she wants without pain and use Lt UE for what she wants   Currently in Pain? Yes   Pain Score 2   0/10 at rest, 2/10 with wrist motion.    Pain Location Wrist   Pain Orientation Left   Pain Descriptors / Indicators Sharp   Aggravating Factors  moving wrist    Pain Relieving Factors rest, Rock tape.             [facility] PT Assessment - 07/24/16 0001      Assessment   Medical Diagnosis Lt radius fx; Rt 5th metatarsal fx    Referring Provider Dr. Helane Rima   Onset Date/Surgical Date 05/08/16   Hand Dominance Right     AROM   AROM Assessment Site  Wrist   Left Wrist Extension 70 Degrees   Left Wrist Flexion 38 Degrees   Left Wrist Radial Deviation 28 Degrees   Left Wrist Ulnar Deviation 21 Degrees          [facility] Adult PT Treatment/Exercise - 07/24/16 0001      Elbow Exercises   Forearm Supination Left;10 reps;Seated;Bar weights/barbell  1#   Forearm Pronation Left;10 reps;Seated;Bar weights/barbell  1#   Wrist Flexion AROM;Left;10 reps  1#,  Then stretches to 20 sec x 2 reps   Wrist Extension AROM;Left;Seated;20 reps;Strengthening  1#   Then stretches to 20 sec x 2 reps     Hand Exercises   Other Hand Exercises soft handmaster:  finger extension / hand grip x 30 reps    Other Hand Exercises yellow digitizer x 20 reps (4th/5th finger difficult);  velcro board with wrist flex/ext, pronation/supination with Lt wrist.     LUE neural glides x 25 reps in supine      Manual Therapy   Joint Mobilization Lt carpal bones AP grade 2-3   Soft tissue mobilization Edge tool assistance to Lt wrist to forearm (palmar/dorsal surface) to decrease  fascial restrictions, increase ROM and decrease pain/ sensitivity.    Myofascial Release pin and stretch to Lt wrist for wrist flex, ext, radial/ulnar deviation with active motion of hand/wrist.     Passive ROM Lt wrist traction with flexion/ ext, supination.    Kinesiotex Edema;Create Space     Kinesiotix   Edema 3 web pieces placed prox to distal over Rt lateral and dorsal surface of forearm to wrist to decrease edema.    Create Space 2- I lift strips applied to Rt fifth met head to decompress tissue and decrease pain.               PT Long Term Goals - 07/24/16 1012      PT LONG TERM GOAL #1   Title Improve functional use of Rt UE allowing patient to use Lt UE for ADL's with minimal to no pain  09/04/16/17   Time 18   Period Weeks   Status New     PT LONG TERM GOAL #2   Title Patient reports that she can play the piano with minimal to no pain for 10-15 min 09/04/16   Time 18    Period Weeks   Status New     PT LONG TERM GOAL #3   Title Prevent decreased AROM Rt shoulder in flex/abd/IR/ER 09/04/16   Time 18   Period Weeks   Status New     PT LONG TERM GOAL #4   Title Independent in HEP 09/04/16   Time 18   Period Weeks   Status Revised     PT LONG TERM GOAL #5   Title Improve FOTO to </= 30% limitation for Rt shoulder; 27% Rt ankle  08/03/16   Time 12   Period Weeks   Status Achieved     PT LONG TERM GOAL #6   Title Improve Rt ankle ROM to WFL's and =/> Lt ankle 08/03/16   Time 18   Period Weeks   Status On-going     PT LONG TERM GOAL #7   Title Increase Rt ankle strength to 5-/5 to 5/5 08/03/16   Time 18   Period Weeks   Status On-going     PT LONG TERM GOAL #8   Title Ambulate with normal gait pattern with minimal to no pain 08/03/16   Time 6   Period Weeks   Status Achieved     PT LONG TERM GOAL  #9   TITLE Improve AROM Lt forearm, wrist and hand to WFL's 09/04/16   Time 18   Period Weeks   Status New     PT LONG TERM GOAL  #10   TITLE Improve strength Lt forearm, wrist and hand to allow patient to preform normal functional activities 09/04/16   Time 18   Period Weeks   Status Revised     PT LONG TERM GOAL  #11   TITLE Patient independent in ADL's Lt UE with minimal pain and limitation 09/04/16   Time 18   Period Weeks   Status Revised               Plan - 07/24/16 1021    Clinical Impression Statement Pt continues to demonstrate gains in Lt wrist ROM each visit.  She has some pain with Lt wrist/forearm exercises; reduced with rest and Rock tape at end of session.  Pt is progressing well towards unmet goals and will benefit from continued PT intervention to maximize functional mobility.    Rehab Potential Good     PT Frequency 2x / week   PT Duration 6 weeks   PT Treatment/Interventions Patient/family education;ADLs/Self Care Home Management;Cryotherapy;Electrical Stimulation;Iontophoresis 79m/ml Dexamethasone;Moist  Heat;Ultrasound;Neuromuscular re-education;Manual techniques;Dry needling;Therapeutic activities;Therapeutic exercise   PT Next Visit Plan Spoke to supervising PT; will send for renewal to MD.  Continue progressive ROM/strengthening Lt wrist and Rt ankle.    Consulted and Agree with Plan of Care Patient      Patient will benefit from skilled therapeutic intervention in order to improve the following deficits and impairments:  Postural dysfunction, Improper body mechanics, Pain, Decreased range of motion, Decreased mobility, Decreased strength, Increased fascial restricitons, Increased muscle spasms, Impaired UE functional use, Decreased activity tolerance, Abnormal gait  Visit Diagnosis: Other symptoms and signs involving the musculoskeletal system - Plan: PT plan of care cert/re-cert  Abnormal posture - Plan: PT plan of care cert/re-cert  Pain in left wrist - Plan: PT plan of care cert/re-cert     Problem List Patient Active Problem List   Diagnosis Date Noted  . Carpal tunnel syndrome, bilateral 06/18/2016  . Closed fracture of left distal radius 05/12/2016  . Impingement syndrome of right shoulder 05/07/2016  . Closed fracture of fifth metatarsal bone of right foot 04/07/2016  . Adjustment disorder with depressed mood 07/07/2012   JKerin Perna PTA 07/24/16 2:02 PM  Celyn P. HHelene KelpPT, MPH 07/24/16 2:02 PM   [facility] [address] [address]Phone: [phone]  Fax:  [phone] Name: HDemetri KermanMRN: [id number] of Birth: [birth year]

## 2016-07-31 ENCOUNTER — Ambulatory Visit (INDEPENDENT_AMBULATORY_CARE_PROVIDER_SITE_OTHER): Payer: BC Managed Care – PPO | Admitting: Rehabilitative and Restorative Service Providers"

## 2016-07-31 ENCOUNTER — Encounter: Payer: Self-pay | Admitting: Rehabilitative and Restorative Service Providers"

## 2016-07-31 DIAGNOSIS — R293 Abnormal posture: Secondary | ICD-10-CM | POA: Diagnosis not present

## 2016-07-31 DIAGNOSIS — R29898 Other symptoms and signs involving the musculoskeletal system: Secondary | ICD-10-CM

## 2016-07-31 DIAGNOSIS — M25532 Pain in left wrist: Secondary | ICD-10-CM

## 2016-07-31 NOTE — Therapy (Signed)
[facility] [place] [address] [address] [place], [place], [address] Phone: [phone]   Fax:  [phone]  Physical Therapy Treatment  Patient Details  Name: Julie Schaefer MRN: [id number] Date of Birth: 01-20-1971 Referring Provider: Dr. Briant Sites  Encounter Date: 07/31/2016      PT End of Session - 07/31/16 1452    Visit Number 13   Number of Visits 24   Date for PT Re-Evaluation 09/04/16   PT Start Time 1452   PT Stop Time 1544   PT Time Calculation (min) 52 min   Activity Tolerance Patient tolerated treatment well      Past Medical History:  Diagnosis Date  . Carpal tunnel syndrome   . Depression   . Migraine   . Seasonal allergies   . Sleep apnea     Past Surgical History:  Procedure Laterality Date  . CARPAL TUNNEL RELEASE  2009  . HERNIA REPAIR  1993    There were no vitals filed for this visit.      Subjective Assessment - 07/31/16 1453    Subjective Pt reports her foot has been feeling pretty good.  She would prefer to focus on her Lt forearm and wrist. She has had positive results wiht Rock tape (less pain, swelling)   Currently in Pain? Yes   Pain Score 2    Pain Location Hand  thumb   Pain Orientation Left   Pain Descriptors / Indicators Sharp   Pain Type Chronic pain   Pain Onset More than a month ago   Pain Frequency Intermittent                         [facility] Adult PT Treatment/Exercise - 07/31/16 0001      Knee/Hip Exercises: Aerobic   Nustep L4: 6 min   arms(9); legs      Hand Exercises   Thumb Opposition to little finger - to base of 5th finger      Wrist Exercises   Wrist Radial Deviation AAROM;Left;10 reps;Seated   Wrist Ulnar Deviation AAROM;Left;10 reps;Seated   Other wrist exercises supination/ pronation x 10 AA for stretch      Moist Heat Therapy   Number Minutes Moist Heat 15 Minutes   Moist Heat Location --  extensor forearm      Cryotherapy   Number  Minutes Cryotherapy 15 Minutes   Cryotherapy Location Wrist  Lt wrist; posterior cervical(for headache)      Manual Therapy   Joint Mobilization Lt carpal bones AP grade 2-3   Soft tissue mobilization Lt extensor forearm; thenar eminence   Myofascial Release extensor forearm   Passive ROM Lt wrist traction with flexion/ ext, supination; thumb adduction  - focus on wrist flexion and thumb adduction           Trigger Point Dry Needling - 07/31/16 1539    Consent Given? Yes   Education Handout Provided Yes   Muscles Treated Upper Body --  extensor forearm - decreased tightness to palpation/twitch              PT Education - 07/31/16 1541    Education provided Yes   Education Details TDN; HEP - stretch for tuumb; reviewed wrist flexor stretch    Person(s) Educated Patient   Methods Explanation;Demonstration;Tactile cues;Verbal cues   Comprehension Verbalized understanding;Returned demonstration;Verbal cues required;Tactile cues required             PT Long Term Goals - 07/31/16  1500      PT LONG TERM GOAL #1   Title Improve functional use of Rt UE allowing patient to use Lt UE for ADL's with minimal to no pain  09/04/16/17   Time 18   Period Weeks   Status On-going     PT LONG TERM GOAL #2   Title Patient reports that she can play the piano with minimal to no pain for 10-15 min 09/04/16   Time 18   Period Weeks   Status On-going     PT LONG TERM GOAL #3   Title Prevent decreased AROM Rt shoulder in flex/abd/IR/ER 09/04/16   Time 18   Period Weeks   Status On-going     PT LONG TERM GOAL #4   Title Independent in HEP 09/04/16   Time 18   Period Weeks   Status On-going     PT LONG TERM GOAL #6   Title Improve Rt ankle ROM to WFL's and =/> Lt ankle 08/03/16   Time 18   Period Weeks   Status On-going     PT LONG TERM GOAL #7   Title Increase Rt ankle strength to 5-/5 to 5/5 08/03/16   Time 18   Status On-going     PT LONG TERM GOAL  #9   TITLE  Improve AROM Lt forearm, wrist and hand to [facility] 09/04/16   Time 18   Period Weeks   Status On-going     PT LONG TERM GOAL  #10   TITLE Improve strength Lt forearm, wrist and hand to allow patient to preform normal functional activities 09/04/16   Time 18   Period Weeks   Status On-going     PT LONG TERM GOAL  #11   TITLE Patient independent in ADL's Lt UE with minimal pain and limitation 09/04/16   Time 18   Period Weeks   Status On-going               Plan - 07/31/16 1601    Clinical Impression Statement Continued gradual progress. Trial of TDN for extensor forearm tolerated well. Added gentle stretch for thumb and reinforced wrist flexor stretch.  Patient not feeling well today - less exercise.    Rehab Potential Good   PT Frequency 2x / week   PT Duration 6 weeks   PT Treatment/Interventions Patient/family education;ADLs/Self Care Home Management;Cryotherapy;Electrical Stimulation;Iontophoresis 4mg /ml Dexamethasone;Moist Heat;Ultrasound;Neuromuscular re-education;Manual techniques;Dry needling;Therapeutic activities;Therapeutic exercise   PT Next Visit Plan continue treatment - focus on Lt UE mobility, strength and function - assess response to TDN, consider TDN to thenar eminence; continue taping    Consulted and Agree with Plan of Care Patient      Patient will benefit from skilled therapeutic intervention in order to improve the following deficits and impairments:  Postural dysfunction, Improper body mechanics, Pain, Decreased range of motion, Decreased mobility, Decreased strength, Increased fascial restricitons, Increased muscle spasms, Impaired UE functional use, Decreased activity tolerance, Abnormal gait  Visit Diagnosis: Other symptoms and signs involving the musculoskeletal system  Abnormal posture  Pain in left wrist     Problem List Patient Active Problem List   Diagnosis Date Noted  . Carpal tunnel syndrome, bilateral 06/18/2016  . Closed fracture  of left distal radius 05/12/2016  . Impingement syndrome of right shoulder 05/07/2016  . Closed fracture of fifth metatarsal bone of right foot 04/07/2016  . Adjustment disorder with depressed mood 07/07/2012    Laural Eiland Rober MinionP Yoshi Mancillas PT, MPH 07/31/2016, 4:10 PM  [facility] [place] [address] [place], [place], [address] Phone: [phone]   Fax:  [phone]  Name: Julie Schaefer MRN: [id number] Date of Birth: Mar 15, 1971

## 2016-07-31 NOTE — Patient Instructions (Signed)

## 2016-08-01 ENCOUNTER — Encounter: Payer: Self-pay | Admitting: Emergency Medicine

## 2016-08-01 ENCOUNTER — Emergency Department
Admission: EM | Admit: 2016-08-01 | Discharge: 2016-08-01 | Disposition: A | Discharge status: Home / Self Care | Payer: BC Managed Care – PPO | Source: Home / Self Care | Attending: Family Medicine | Admitting: Family Medicine

## 2016-08-01 DIAGNOSIS — H6692 Otitis media, unspecified, left ear: Secondary | ICD-10-CM | POA: Diagnosis not present

## 2016-08-01 DIAGNOSIS — H7292 Unspecified perforation of tympanic membrane, left ear: Secondary | ICD-10-CM

## 2016-08-01 DIAGNOSIS — J040 Acute laryngitis: Secondary | ICD-10-CM

## 2016-08-01 MED ORDER — AMOXICILLIN-POT CLAVULANATE 875-125 MG PO TABS: 1.0000 | ORAL_TABLET | Freq: Two times a day (BID) | ORAL | 0 refills | Status: DC

## 2016-08-01 NOTE — Discharge Instructions (Signed)
°  You may continue to take the prednisone you were prescribed by your primary care provider.  Please take antibiotics as prescribed and be sure to complete entire course even if you start to feel better to ensure infection does not come back.

## 2016-08-01 NOTE — ED Provider Notes (Signed)
CSN: [id number]     Arrival date & time 08/01/16  4098 History   First MD Initiated Contact with Patient 08/01/16 0915     Chief Complaint  Patient presents with  . Otalgia   (Consider location/radiation/quality/duration/timing/severity/associated sxs/prior Treatment) HPI Julie Schaefer is a 45 y.o. female presenting to UC with c/o gradually worsening URI symptoms for about 1 week with nasal congestion, sore throat with hoarse voice and mild intermittent productive cough.  Throat pain has resolved but she developed ear pain yesterday.  She was seen by her PCP earlier in the week but was not prescribed antibiotics as symptoms were determined to be viral in nature. She has been taking Mucinex and using her albuterol with some relief.  Pt called PCP yesterday about ear pain, prednisone was called in, she started taking this morning.  She noticed this morning when she woke up, she had blood draining from her ear and mildly muffled sound.  Denies fever, chills, n/v/d. 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    Past Medical History:  Diagnosis Date  . Carpal tunnel syndrome   . Depression   . Migraine   . Seasonal allergies   . Sleep apnea    Past Surgical History:  Procedure Laterality Date  . CARPAL TUNNEL RELEASE  2009  . HERNIA REPAIR  1993   Family History  Problem Relation Age of Onset  . Esophageal cancer Father   . Stroke Father   . Coronary artery disease Father   . Hypertension Father   . Heart attack Father   . Depression Mother   . Carpal tunnel syndrome Daughter    Social History  Substance Use Topics  . Smoking status: Never Smoker  . Smokeless tobacco: Never Used  . Alcohol use Yes     Comment: Rare   OB History    No data available     Review of Systems  Constitutional: Negative for chills and fever.  HENT: Positive for congestion, ear discharge ( blood from Left ear), ear pain (Left) and voice change. Negative for sore throat and trouble  swallowing.   Respiratory: Positive for cough. Negative for shortness of breath.   Cardiovascular: Negative for chest pain and palpitations.  Gastrointestinal: Negative for abdominal pain, diarrhea, nausea and vomiting.  Musculoskeletal: Negative for arthralgias, back pain and myalgias.  Skin: Negative for rash.    Allergies  Review of patient's allergies indicates no known allergies.  Home Medications   Prior to Admission medications   Medication Sig Start Date End Date Taking? Authorizing Provider  AMBULATORY NON FORMULARY MEDICATION Rolling Knee Scooter, please take Rx to medical supply store. 04/07/16   Monica Becton, MD  amoxicillin-clavulanate (AUGMENTIN) 875-125 MG tablet Take 1 tablet by mouth 2 (two) times daily. One po bid x 7 days 08/01/16   Junius Finner, PA-C  cetirizine (ZYRTEC) 10 MG tablet Take 10 mg by mouth daily.    Historical Provider, MD  citalopram (CELEXA) 40 MG tablet Take 1 tablet (40 mg total) by mouth daily. 12/13/14   Thresa Ross, MD  PROAIR HFA 108 (90 BASE) MCG/ACT inhaler  04/07/13   Historical Provider, MD  SUMAtriptan (IMITREX) 100 MG tablet Take 100 mg by mouth Once daily as needed. 08/12/12   Historical Provider, MD   Meds Ordered and Administered this Visit  Medications - No data to display  BP 137/86 (BP Location: Right Arm)   Pulse 83   Temp 98 F (36.7 C) (Oral)  Wt 241 lb (109.3 kg)   SpO2 99%   BMI 38.90 kg/m  No data found.   Physical Exam  Constitutional: She appears well-developed and well-nourished. No distress.  HENT:  Head: Normocephalic and atraumatic.  Right Ear: Tympanic membrane normal.  Left Ear: There is drainage ( small amount of red blood in canal.). No mastoid tenderness. Tympanic membrane is perforated and erythematous.  Nose: Nose normal.  Mouth/Throat: Uvula is midline, oropharynx is clear and moist and mucous membranes are normal.  Eyes: Conjunctivae are normal. No scleral icterus.  Neck: Normal range of  motion. Neck supple.  Hoarse voice but no stridor.  Cardiovascular: Normal rate, regular rhythm and normal heart sounds.   Pulmonary/Chest: Effort normal and breath sounds normal. No stridor. No respiratory distress. She has no wheezes. She has no rales.  Abdominal: Soft. She exhibits no distension. There is no tenderness.  Musculoskeletal: Normal range of motion.  Lymphadenopathy:    She has no cervical adenopathy.  Neurological: She is alert.  Skin: Skin is warm and dry. She is not diaphoretic.  Nursing note and vitals reviewed.   Urgent Care Course   Clinical Course    Procedures (including critical care time)  Labs Review Labs Reviewed - No data to display  Imaging Review No results found.    MDM   1. Otitis media of left ear with rupture of tympanic membrane   2. Laryngitis    Pt c/o worsening URI symptoms that have developed into Left ear pain. Pt woke with blood draining from Left ear this morning.  Exam c/w Left AOM with rupture of TM  Rx: Augmentin  Encouraged to keep taking the prednisone and plain mucinex. F/u with PCP in 1 week if not improving, sooner if worsening. Patient verbalized understanding and agreement with treatment plan.     Junius FinnerErin O'Malley, PA-C 08/01/16 [phone]

## 2016-08-01 NOTE — ED Triage Notes (Signed)
Pt states she woke up this am with bleeding in her left ear. Mildly painful.

## 2016-08-03 ENCOUNTER — Encounter: Payer: Self-pay | Admitting: Physical Therapy

## 2016-08-07 ENCOUNTER — Ambulatory Visit (INDEPENDENT_AMBULATORY_CARE_PROVIDER_SITE_OTHER): Payer: BC Managed Care – PPO | Admitting: Physical Therapy

## 2016-08-07 DIAGNOSIS — M25532 Pain in left wrist: Secondary | ICD-10-CM | POA: Diagnosis not present

## 2016-08-07 DIAGNOSIS — R293 Abnormal posture: Secondary | ICD-10-CM | POA: Diagnosis not present

## 2016-08-07 DIAGNOSIS — R29898 Other symptoms and signs involving the musculoskeletal system: Secondary | ICD-10-CM | POA: Diagnosis not present

## 2016-08-07 NOTE — Therapy (Signed)
[facility] [address] [place], [place], [address] Phone: [phone]   Fax:  [phone]  Physical Therapy Treatment  Patient Details  Name: Julie Schaefer MRN: [id number] Date of Birth: [birth year] Referring Provider: Dr. Helane Rima  Encounter Date: 08/07/2016      PT End of Session - 08/07/16 0814    Visit Number 14   Number of Visits 24   Date for PT Re-Evaluation 09/04/16   PT Start Time 0807  pt arrived late   PT Stop Time 0852   PT Time Calculation (min) 45 min   Activity Tolerance Patient tolerated treatment well;No increased pain   Behavior During Therapy WFL for tasks assessed/performed      Past Medical History:  Diagnosis Date  . Carpal tunnel syndrome   . Depression   . Migraine   . Seasonal allergies   . Sleep apnea     Past Surgical History:  Procedure Laterality Date  . CARPAL TUNNEL RELEASE  2009  . HERNIA REPAIR  1993    There were no vitals filed for this visit.      Subjective Assessment - 08/07/16 0814    Subjective Pt reports her eardrum "busted" Saturday.  She has been on Prednisone since then, her dose tapering now.     Currently in Pain? No/denies            [facility] PT Assessment - 08/07/16 0001      AROM   Left Forearm Pronation 85 Degrees   Left Forearm Supination 90 Degrees   Left Wrist Extension 72 Degrees   Left Wrist Flexion 46 Degrees   Left Wrist Radial Deviation 25 Degrees   Left Wrist Ulnar Deviation 36 Degrees     Strength   Right Hand Grip (lbs) 66   Right Hand 3 Point Pinch 11 lbs   Left Hand Grip (lbs) 41   Left Hand 3 Point Pinch 5 lbs          [facility] Adult PT Treatment/Exercise - 08/07/16 0001      Exercises   Exercises Wrist;Elbow     Hand Exercises   Other Hand Exercises medium handmaster:  finger extension / hand grip x 30 reps    Other Hand Exercises yellow digitizer x 20 reps (4th/5th finger difficult);  velcro board with wrist  flex/ext, pronation/supination with Lt wrist.     LUE neural glides x 25 reps in supine      Wrist Exercises   Other wrist exercises Velcro board : supination/ pronation with various grips, Lt wrist flex / ext with roller.    Other wrist exercises Lt wrist circles, flex/ext x 30 each for warm up.  Lt wrist stretches in flex / ext 30 sec x 3 reps each      Manual Therapy   Soft tissue mobilization Edge tool assistance to Lt wrist to forearm (palmar/dorsal surface) to decrease fascial restrictions, increase ROM and decrease pain/ sensitivity.    Myofascial Release pin and stretch to Lt wrist for wrist flex, ext, radial/ulnar deviation with active motion of hand/wrist.     Kinesiotex Edema;Create Space     Kinesiotix   Edema 3 web pieces placed prox to distal over Rt lateral and dorsal surface of forearm to wrist to decrease edema.    Create Space 2- I lift strips applied to Rt fifth met head to decompress tissue and decrease pain.             PT Long Term  Goals - 07/31/16 1500      PT LONG TERM GOAL #1   Title Improve functional use of Rt UE allowing patient to use Lt UE for ADL's with minimal to no pain  09/04/16/17   Time 18   Period Weeks   Status On-going     PT LONG TERM GOAL #2   Title Patient reports that she can play the piano with minimal to no pain for 10-15 min 09/04/16   Time 18   Period Weeks   Status On-going     PT LONG TERM GOAL #3   Title Prevent decreased AROM Rt shoulder in flex/abd/IR/ER 09/04/16   Time 18   Period Weeks   Status On-going     PT LONG TERM GOAL #4   Title Independent in HEP 09/04/16   Time 18   Period Weeks   Status On-going     PT LONG TERM GOAL #6   Title Improve Rt ankle ROM to WFL's and =/> Lt ankle 08/03/16   Time 18   Period Weeks   Status On-going     PT LONG TERM GOAL #7   Title Increase Rt ankle strength to 5-/5 to 5/5 08/03/16   Time 18   Status On-going     PT LONG TERM GOAL  #9   TITLE Improve AROM Lt forearm, wrist  and hand to [facility] 09/04/16   Time 18   Period Weeks   Status On-going     PT LONG TERM GOAL  #10   TITLE Improve strength Lt forearm, wrist and hand to allow patient to preform normal functional activities 09/04/16   Time 18   Period Weeks   Status On-going     PT LONG TERM GOAL  #11   TITLE Patient independent in ADL's Lt UE with minimal pain and limitation 09/04/16   Time 18   Period Weeks   Status On-going               Plan - 08/07/16 0829    Clinical Impression Statement Focus has been on wrist per pt request.  Pt demonstrated improved Lt wrist ROM and grip strength in hand.  She was able to tolerate increased density grip ball for strengthening exercise without any symptoms.  Progressing towards goals.    Rehab Potential Good   PT Frequency 2x / week   PT Duration 6 weeks   PT Treatment/Interventions Patient/family education;ADLs/Self Care Home Management;Cryotherapy;Electrical Stimulation;Iontophoresis 2m/ml Dexamethasone;Moist Heat;Ultrasound;Neuromuscular re-education;Manual techniques;Dry needling;Therapeutic activities;Therapeutic exercise   PT Next Visit Plan continue treatment - focus on Lt UE mobility, strength and function continue taping       Patient will benefit from skilled therapeutic intervention in order to improve the following deficits and impairments:  Postural dysfunction, Improper body mechanics, Pain, Decreased range of motion, Decreased mobility, Decreased strength, Increased fascial restricitons, Increased muscle spasms, Impaired UE functional use, Decreased activity tolerance, Abnormal gait  Visit Diagnosis: Other symptoms and signs involving the musculoskeletal system  Abnormal posture  Pain in left wrist     Problem List Patient Active Problem List   Diagnosis Date Noted  . Carpal tunnel syndrome, bilateral 06/18/2016  . Closed fracture of left distal radius 05/12/2016  . Impingement syndrome of right shoulder 05/07/2016  .  Closed fracture of fifth metatarsal bone of right foot 04/07/2016  . Adjustment disorder with depressed mood 07/07/2012   JKerin Perna PTA 08/07/16 9:40 AM  [place] [address]  [place], [place], [address] Phone: [phone]   Fax:  [phone]  Name: Ayda Tancredi MRN: [id number] Date of Birth: [birth year]

## 2016-08-14 ENCOUNTER — Encounter: Payer: Self-pay | Admitting: Rehabilitative and Restorative Service Providers"

## 2016-08-17 ENCOUNTER — Encounter: Payer: Self-pay | Admitting: Sports Medicine

## 2016-08-19 ENCOUNTER — Encounter: Payer: Self-pay | Admitting: Sports Medicine

## 2016-08-19 MED ORDER — MELOXICAM 15 MG PO TABS: 15.0000 mg | ORAL_TABLET | Freq: Every day | ORAL | 0 refills | Status: DC

## 2016-08-21 ENCOUNTER — Ambulatory Visit (INDEPENDENT_AMBULATORY_CARE_PROVIDER_SITE_OTHER): Payer: BC Managed Care – PPO | Admitting: Physical Therapy

## 2016-08-21 DIAGNOSIS — M79671 Pain in right foot: Secondary | ICD-10-CM | POA: Diagnosis not present

## 2016-08-21 DIAGNOSIS — R293 Abnormal posture: Secondary | ICD-10-CM

## 2016-08-21 DIAGNOSIS — M25532 Pain in left wrist: Secondary | ICD-10-CM

## 2016-08-21 DIAGNOSIS — R29898 Other symptoms and signs involving the musculoskeletal system: Secondary | ICD-10-CM

## 2016-08-21 NOTE — Therapy (Signed)
[facility]  [address] [place], [place], [address] Phone: [phone]   Fax:  [phone]  Physical Therapy Treatment  Patient Details  Name: Julie Schaefer MRN: [id number] Date of Birth: 1971-10-10 Referring Provider: Dr. Helane Rima  Encounter Date: 08/21/2016      PT End of Session - 08/21/16 0812    Visit Number 15   Number of Visits 24   Date for PT Re-Evaluation 09/04/16   PT Start Time 0805   PT Stop Time 0845   PT Time Calculation (min) 40 min   Activity Tolerance Patient tolerated treatment well      Past Medical History:  Diagnosis Date  . Carpal tunnel syndrome   . Depression   . Migraine   . Seasonal allergies   . Sleep apnea     Past Surgical History:  Procedure Laterality Date  . CARPAL TUNNEL RELEASE  2009  . HERNIA REPAIR  1993    There were no vitals filed for this visit.      Subjective Assessment - 08/21/16 0812    Subjective Pt reports she hurts more in her Lt wrist than usual. 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     Currently in Pain? Yes   Pain Score 3    Pain Location Wrist   Pain Orientation Left   Pain Descriptors / Indicators Sharp   Aggravating Factors  moving wrist   Pain Relieving Factors rest, rock tape    Pain Score 1   Pain Location Ankle   Pain Orientation Left;Right   Pain Descriptors / Indicators Sore   Aggravating Factors  walking too much   Pain Relieving Factors rest, elevating foot             [facility] PT Assessment - 08/21/16 0001      AROM   Left Wrist Extension 80 Degrees   Left Wrist Flexion 43 Degrees   Left Wrist Radial Deviation 30 Degrees   Left Wrist Ulnar Deviation 38 Degrees     Strength   Right Hand Grip (lbs) 75   Left Hand Grip (lbs) 32          [facility] Adult PT Treatment/Exercise - 08/21/16 0001      Hand Exercises   Other Hand Exercises yellow  digitizer x 20 reps (5th finger difficult)     Wrist Exercises   Other wrist exercises wrist circles x 40,  AROM wrist flex/ext,  wrist stretches in flex/ ext x 30 sec x 2 reps (Lt).       Ultrasound   Ultrasound Parameters 50%m 1.0 w/cm2, 8 min      Manual Therapy   Joint Mobilization Lt carpal bones AP grade 2-3  difficulty tolerating due to tenderness    Soft tissue mobilization Lt extensor forearm; thenar eminence   Myofascial Release pin and stretch to Lt wrist for wrist flex, ext, radial/ulnar deviation with active motion of hand/wrist.     Passive ROM Lt wrist traction with flexion/ ext, supination; thumb adduction  - focus on wrist flexion and thumb adduction      Kinesiotix   Edema 3 web pieces placed prox to distal over Rt lateral and dorsal surface of forearm to wrist to decrease edema.    Create Space 2- I lift strips applied to Lt thumb ext tendon and and wrist ext @ carpal bones to decompress tissue and decrease pain.  Ankle Exercises: Stretches   Soleus Stretch 3 reps;30 seconds   Gastroc Stretch 3 reps;30 seconds                     PT Long Term Goals - 08/21/16 0859      PT LONG TERM GOAL #1   Title Improve functional use of Rt UE allowing patient to use Lt UE for ADL's with minimal to no pain  09/04/16/17   Time 18   Period Weeks   Status Partially Met  Pt using RUE without difficulty     PT LONG TERM GOAL #2   Title Patient reports that she can play the piano with minimal to no pain for 10-15 min 09/04/16   Time 18   Period Weeks   Status On-going  Pain at least 4/10 with piano playing     PT LONG TERM GOAL #3   Title Prevent decreased AROM Rt shoulder in flex/abd/IR/ER 09/04/16   Time 18   Period Weeks   Status Achieved     PT LONG TERM GOAL #4   Title Independent in HEP 09/04/16   Time 18   Period Weeks   Status On-going     PT LONG TERM GOAL #5   Title Improve FOTO to </= 30% limitation for Rt shoulder; 27% Rt ankle   08/03/16   Time 12   Period Weeks   Status Achieved     PT LONG TERM GOAL #6   Title Improve Rt ankle ROM to WFL's and =/> Lt ankle 08/03/16   Time 18   Period Weeks   Status On-going  not tested this week     PT LONG TERM GOAL #7   Title Increase Rt ankle strength to 5-/5 to 5/5 08/03/16   Time 18   Period Weeks   Status On-going     PT LONG TERM GOAL #8   Title Ambulate with normal gait pattern with minimal to no pain 08/03/16   Time 6   Period Weeks   Status Achieved     PT LONG TERM GOAL  #9   TITLE Improve AROM Lt forearm, wrist and hand to [facility] 09/04/16   Time 18   Period Weeks   Status On-going     PT LONG TERM GOAL  #10   TITLE Improve strength Lt forearm, wrist and hand to allow patient to preform normal functional activities 09/04/16   Time 18   Period Weeks   Status On-going     PT LONG TERM GOAL  #11   TITLE Patient independent in ADL's Lt UE with minimal pain and limitation 09/04/16   Time 18   Period Weeks   Status On-going               Plan - 08/21/16 0856    Clinical Impression Statement Pt's grip strength in Lt hand has decreased since last visit.  Pt now doing more in community and feels her hand is sore/fatigued - could be contributing factor.  Lt wrist ROM was slightly improved since last visit.  Pt point tender in Lt carpal bones, brachioradialis, and along thumb extensor tendons.  Pt continues to make gradual gains towards remaining goals.    Rehab Potential Good   PT Frequency 2x / week   PT Duration 6 weeks   PT Treatment/Interventions Patient/family education;ADLs/Self Care Home Management;Cryotherapy;Electrical Stimulation;Iontophoresis 78m/ml Dexamethasone;Moist Heat;Ultrasound;Neuromuscular re-education;Manual techniques;Dry needling;Therapeutic activities;Therapeutic exercise   PT Next Visit Plan continue treatment - focus  on Lt UE mobility, strength and function continue taping    Consulted and Agree with Plan of Care Patient       Patient will benefit from skilled therapeutic intervention in order to improve the following deficits and impairments:  Postural dysfunction, Improper body mechanics, Pain, Decreased range of motion, Decreased mobility, Decreased strength, Increased fascial restricitons, Increased muscle spasms, Impaired UE functional use, Decreased activity tolerance, Abnormal gait  Visit Diagnosis: Pain in left wrist  Other symptoms and signs involving the musculoskeletal system  Abnormal posture  Pain in right foot     Problem List Patient Active Problem List   Diagnosis Date Noted  . Carpal tunnel syndrome, bilateral 06/18/2016  . Closed fracture of left distal radius 05/12/2016  . Impingement syndrome of right shoulder 05/07/2016  . Closed fracture of fifth metatarsal bone of right foot 04/07/2016  . Adjustment disorder with depressed mood 07/07/2012   Kerin Perna, PTA 08/21/16 9:15 AM  [facility] [address] [place], [place], [address] Phone: [phone]   Fax:  [phone]  Name: Julie Schaefer MRN: [id number] Date of Birth: Dec 15, 1970

## 2016-08-24 ENCOUNTER — Ambulatory Visit (INDEPENDENT_AMBULATORY_CARE_PROVIDER_SITE_OTHER): Payer: BC Managed Care – PPO | Admitting: Physical Therapy

## 2016-08-24 DIAGNOSIS — R293 Abnormal posture: Secondary | ICD-10-CM | POA: Diagnosis not present

## 2016-08-24 DIAGNOSIS — R29898 Other symptoms and signs involving the musculoskeletal system: Secondary | ICD-10-CM | POA: Diagnosis not present

## 2016-08-24 DIAGNOSIS — M25532 Pain in left wrist: Secondary | ICD-10-CM | POA: Diagnosis not present

## 2016-08-24 NOTE — Therapy (Signed)
[place], [place], [address] Phone: [phone]   Fax:  [phone]  Physical Therapy Treatment  Patient Details  Name: Julie Schaefer MRN: [id number] Date of Birth: 1971/07/13 Referring Provider: Dr. Helane Rima  Encounter Date: 08/24/2016      PT End of Session - 08/24/16 1201    Visit Number 16   Number of Visits 24   Date for PT Re-Evaluation 09/04/16   PT Start Time 1154  pt arrived late   PT Stop Time 1240   PT Time Calculation (min) 46 min   Activity Tolerance Patient tolerated treatment well   Behavior During Therapy [facility] for tasks assessed/performed      Past Medical History:  Diagnosis Date  . Carpal tunnel syndrome   . Depression   . Migraine   . Seasonal allergies   . Sleep apnea     Past Surgical History:  Procedure Laterality Date  . CARPAL TUNNEL RELEASE  2009  . HERNIA REPAIR  1993    There were no vitals filed for this visit.      Subjective Assessment - 08/24/16 1302    Subjective Pt reports her Lt wrist has been hurting less now that she is taking Meloxicam.  Rock tape continues to help with pain and swelling.  She is noticing improved mobility in Lt wrist.    Currently in Pain? No/denies            [facility] PT Assessment - 08/24/16 0001      Assessment   Medical Diagnosis Lt radius fx; Rt 5th metatarsal fx    Referring Provider Dr. Helane Rima   Onset Date/Surgical Date 05/08/16   Hand Dominance Right   Next MD Visit PRN     AROM   Left Wrist Extension 84 Degrees   Left Wrist Flexion 45 Degrees     Strength   Right Hand Grip (lbs) 70   Left Hand Grip (lbs) 40          [facility] Adult PT Treatment/Exercise - 08/24/16 0001      Hand Exercises   Other Hand Exercises  velcro board with wrist flex/ext, pronation/supination with Lt wrist.        Wrist Exercises   Other wrist exercises wrist circles x 40,  AROM wrist flex/ext,  wrist stretches in  flex/ ext x 30 sec x 4 reps (Lt).       Ultrasound   Ultrasound Location Lt wrist (medial/lateral)    Ultrasound Parameters 50%, 1.0 wcm2, 8 min    Ultrasound Goals Edema;Pain     Manual Therapy   Joint Mobilization Lt carpal bones AP grade 2-3   Soft tissue mobilization Edge tool assistance to Lt wrist to forearm (palmar/dorsal surface) to decrease fascial restrictions, increase ROM and decrease pain/ sensitivity.    Myofascial Release pin and stretch to Lt wrist for wrist flex, ext, radial/ulnar deviation with active motion of hand/wrist.     Passive ROM Lt wrist traction with flexion/ ext, supination; thumb adduction  - focus on wrist flexion and thumb adduction    Kinesiotex Edema;Create Space     Kinesiotix   Edema 3 web pieces placed prox to distal over Rt lateral and dorsal surface of forearm to wrist to decrease edema.    Create Space 2- I lift strips applied to Lt thumb ext tendon and and wrist ext @ carpal bones to decompress tissue and decrease pain.  PT Long Term Goals - 08/21/16 0859      PT LONG TERM GOAL #1   Title Improve functional use of Rt UE allowing patient to use Lt UE for ADL's with minimal to no pain  09/04/16/17   Time 18   Period Weeks   Status Partially Met  Pt using RUE without difficulty     PT LONG TERM GOAL #2   Title Patient reports that she can play the piano with minimal to no pain for 10-15 min 09/04/16   Time 18   Period Weeks   Status On-going  Pain at least 4/10 with piano playing     PT LONG TERM GOAL #3   Title Prevent decreased AROM Rt shoulder in flex/abd/IR/ER 09/04/16   Time 18   Period Weeks   Status Achieved     PT LONG TERM GOAL #4   Title Independent in HEP 09/04/16   Time 18   Period Weeks   Status On-going     PT LONG TERM GOAL #5   Title Improve FOTO to </= 30% limitation for Rt shoulder; 27% Rt ankle  08/03/16   Time 12   Period Weeks   Status Achieved     PT LONG TERM GOAL #6    Title Improve Rt ankle ROM to WFL's and =/> Lt ankle 08/03/16   Time 18   Period Weeks   Status On-going  not tested this week     PT LONG TERM GOAL #7   Title Increase Rt ankle strength to 5-/5 to 5/5 08/03/16   Time 18   Period Weeks   Status On-going     PT LONG TERM GOAL #8   Title Ambulate with normal gait pattern with minimal to no pain 08/03/16   Time 6   Period Weeks   Status Achieved     PT LONG TERM GOAL  #9   TITLE Improve AROM Lt forearm, wrist and hand to [facility] 09/04/16   Time 18   Period Weeks   Status On-going     PT LONG TERM GOAL  #10   TITLE Improve strength Lt forearm, wrist and hand to allow patient to preform normal functional activities 09/04/16   Time 18   Period Weeks   Status On-going     PT LONG TERM GOAL  #11   TITLE Patient independent in ADL's Lt UE with minimal pain and limitation 09/04/16   Time 18   Period Weeks   Status On-going               Plan - 08/24/16 1212    Clinical Impression Statement Pt reporting less pain, now taking Meloxicam daily.  She was able to play 6 songs over an hour service.  She demonstrated improved strength and ROM gains in Lt wrist.     Rehab Potential Good   PT Frequency 2x / week   PT Duration 6 weeks   PT Treatment/Interventions Patient/family education;ADLs/Self Care Home Management;Cryotherapy;Electrical Stimulation;Iontophoresis 23m/ml Dexamethasone;Moist Heat;Ultrasound;Neuromuscular re-education;Manual techniques;Dry needling;Therapeutic activities;Therapeutic exercise   Consulted and Agree with Plan of Care Patient      Patient will benefit from skilled therapeutic intervention in order to improve the following deficits and impairments:  Postural dysfunction, Improper body mechanics, Pain, Decreased range of motion, Decreased mobility, Decreased strength, Increased fascial restricitons, Increased muscle spasms, Impaired UE functional use, Decreased activity tolerance, Abnormal gait  Visit  Diagnosis: Pain in left wrist  Other symptoms and signs involving the musculoskeletal system  Abnormal posture     Problem List Patient Active Problem List   Diagnosis Date Noted  . Carpal tunnel syndrome, bilateral 06/18/2016  . Closed fracture of left distal radius 05/12/2016  . Impingement syndrome of right shoulder 05/07/2016  . Closed fracture of fifth metatarsal bone of right foot 04/07/2016  . Adjustment disorder with depressed mood 07/07/2012   Kerin Perna, PTA 08/24/16 1:03 PM  [place], [place], [address] Phone: [phone]   Fax:  [phone]  Name: Julie Schaefer MRN: [id number] Date of Birth: 1971/01/19

## 2016-08-27 ENCOUNTER — Encounter: Payer: Self-pay | Admitting: Sports Medicine

## 2016-09-04 ENCOUNTER — Ambulatory Visit (INDEPENDENT_AMBULATORY_CARE_PROVIDER_SITE_OTHER): Payer: BC Managed Care – PPO | Admitting: Physical Therapy

## 2016-09-04 DIAGNOSIS — M79671 Pain in right foot: Secondary | ICD-10-CM | POA: Diagnosis not present

## 2016-09-04 DIAGNOSIS — R29898 Other symptoms and signs involving the musculoskeletal system: Secondary | ICD-10-CM

## 2016-09-04 DIAGNOSIS — M25532 Pain in left wrist: Secondary | ICD-10-CM

## 2016-09-04 NOTE — Therapy (Signed)
[place], [place], [address] Phone: [phone]   Fax:  [phone]  Physical Therapy Treatment  Patient Details  Name: Julie Schaefer MRN: [id number] Date of Birth: [birth year] Referring Provider: Dr. Helane Rima.  Encounter Date: 09/04/2016      PT End of Session - 09/04/16 0854    Visit Number 17   Number of Visits 29   Date for PT Re-Evaluation 10/16/16   PT Start Time 0810   PT Stop Time 0855   PT Time Calculation (min) 45 min      Past Medical History:  Diagnosis Date  . Carpal tunnel syndrome   . Depression   . Migraine   . Seasonal allergies   . Sleep apnea     Past Surgical History:  Procedure Laterality Date  . CARPAL TUNNEL RELEASE  2009  . HERNIA REPAIR  1993    There were no vitals filed for this visit.          [facility] PT Assessment - 09/04/16 0001      Assessment   Medical Diagnosis Lt radius fx; Rt 5th metatarsal fx    Referring Provider Dr. Helane Rima.   Onset Date/Surgical Date 05/08/16   Hand Dominance Right   Next MD Visit PRN     AROM   Left Forearm Pronation 90 Degrees   Left Forearm Supination 90 Degrees   Left Wrist Extension 85 Degrees   Left Wrist Flexion 45 Degrees   Left Wrist Radial Deviation 33 Degrees   Left Wrist Ulnar Deviation 38 Degrees     Strength   Right Hand Grip (lbs) 72   Left Hand Grip (lbs) 35   Right/Left Ankle Right   Right Ankle Dorsiflexion 5/5   Right Ankle Plantar Flexion 5/5   Right Ankle Inversion 5/5   Right Ankle Eversion 5/5          [facility] Adult PT Treatment/Exercise - 09/04/16 0001      Knee/Hip Exercises: Stretches   Gastroc Stretch Right;2 reps;30 seconds     Hand Exercises   Other Hand Exercises med handmaster:  finger extension / hand grip x 30 reps    Other Hand Exercises yellow digitizer x 20 reps (5th finger difficult)     Wrist Exercises   Wrist Radial Deviation Strengthening;Left;5 reps    Bar Weights/Barbell (Radial Deviation) 1 lb   Other wrist exercises wrist circles x 40,  AROM wrist flex/ext,  wrist stretches in flex/ ext x 30 sec x 4 reps (Lt).     Other wrist exercises Lt wrist flex/ ext with 1#, x 10 reps each direction x 2 sets     Manual Therapy   Manual therapy comments 5 min ice massage.    Joint Mobilization Lt carpal bones AP grade 2-3   Soft tissue mobilization to Lt wrist to forearm (palmar/dorsal surface) to decrease fascial restrictions, increase ROM and decrease pain/ sensitivity.    Myofascial Release pin and stretch to Lt wrist for wrist flex, ext, radial/ulnar deviation with active motion of hand/wrist.     Passive ROM Lt wrist traction with flexion/ ext, supination; thumb adduction  - focus on wrist flexion and thumb adduction      Ankle Exercises: Standing   Heel Raises 20 reps  RLE                     PT Long Term Goals - 09/04/16 0827      PT LONG  TERM GOAL #1   Title Improve functional use of Rt UE allowing patient to use Lt UE for ADL's with minimal to no pain  09/04/16/17   Time 18   Period Weeks   Status Achieved     PT LONG TERM GOAL #2   Title Patient reports that she can play the piano with minimal to no pain for 10-15 min 10/16/16   Time 18   Period Weeks   Status Partially Met     PT LONG TERM GOAL #3   Title Prevent decreased AROM Rt shoulder in flex/abd/IR/ER 09/04/16   Time 18   Period Weeks   Status Achieved     PT LONG TERM GOAL #4   Title Independent in HEP 10/16/16   Time 18   Period Weeks   Status On-going     PT LONG TERM GOAL #5   Title Improve FOTO to </= 30% limitation for Rt shoulder; 27% Rt ankle  08/03/16   Time 12   Period Weeks   Status Achieved     PT LONG TERM GOAL #6   Title Improve Rt ankle ROM to WFL's and =/> Lt ankle 08/03/16   Time 18   Period Weeks   Status Achieved     PT LONG TERM GOAL #7   Title Increase Rt ankle strength to 5-/5 to 5/5 08/03/16   Time 18   Period Weeks    Status Achieved     PT LONG TERM GOAL #8   Title Ambulate with normal gait pattern with minimal to no pain 08/03/16   Time 6   Period Weeks   Status Achieved     PT LONG TERM GOAL  #9   TITLE Improve AROM Lt forearm, wrist and hand to WFL's to allow her to knit per her previous level 10/16/16   Time 18   Period Weeks   Status Revised     PT LONG TERM GOAL  #10   TITLE Improve strength Lt forearm, wrist and hand to allow patient to preform normal functional activities 10/16/16   Time 18   Status On-going  unable to push piano, bear wt, has dexterity challenges with piano.     PT LONG TERM GOAL  #11   TITLE Patient independent in ADL's Lt UE with minimal pain and limitation 09/04/16   Time 18   Status Achieved     PT LONG TERM GOAL  #12   TITLE report ability to push her piano with minimal to no wrist pain ( 10/16/16)    Time 6   Period Weeks   Status New               Plan - 09/04/16 0844    Clinical Impression Statement Pt has met goals related to foot and Rt shoulder.  She continues to have limitations with Lt wrist related to job tasks (piano playing, pushing piano, and knitting). Lt ROM has slightly improved since last visit.  She desires to continue therapy to improve functional mobility.     Rehab Potential Good   PT Frequency 1x / week   PT Duration 6 weeks   PT Treatment/Interventions Patient/family education;ADLs/Self Care Home Management;Cryotherapy;Electrical Stimulation;Iontophoresis 22m/ml Dexamethasone;Moist Heat;Ultrasound;Neuromuscular re-education;Manual techniques;Dry needling;Therapeutic activities;Therapeutic exercise   PT Next Visit Plan Spoke to supervising PT and pt's progress; will request additional visits to address limitations in Lt wrist/forearm.     Consulted and Agree with Plan of Care Patient      Patient will  benefit from skilled therapeutic intervention in order to improve the following deficits and impairments:  Postural  dysfunction, Improper body mechanics, Pain, Decreased range of motion, Decreased mobility, Decreased strength, Increased fascial restricitons, Increased muscle spasms, Impaired UE functional use, Decreased activity tolerance, Abnormal gait  Visit Diagnosis: Pain in left wrist - Plan: PT plan of care cert/re-cert  Other symptoms and signs involving the musculoskeletal system - Plan: PT plan of care cert/re-cert  Pain in right foot - Plan: PT plan of care cert/re-cert     Problem List Patient Active Problem List   Diagnosis Date Noted  . Carpal tunnel syndrome, bilateral 06/18/2016  . Closed fracture of left distal radius 05/12/2016  . Impingement syndrome of right shoulder 05/07/2016  . Closed fracture of fifth metatarsal bone of right foot 04/07/2016  . Adjustment disorder with depressed mood 07/07/2012   Kerin Perna, PTA 09/04/16 1:22 PM  [place], [place], [address] Phone: [phone]   Fax:  [phone]  Name: Julie Schaefer MRN: [id number] Date of Birth: 02-12-1971  Jeral Pinch, PT 09/04/16 1:23 PM

## 2016-09-08 ENCOUNTER — Ambulatory Visit (INDEPENDENT_AMBULATORY_CARE_PROVIDER_SITE_OTHER): Payer: BC Managed Care – PPO | Admitting: Physical Therapy

## 2016-09-08 DIAGNOSIS — R29898 Other symptoms and signs involving the musculoskeletal system: Secondary | ICD-10-CM | POA: Diagnosis not present

## 2016-09-08 DIAGNOSIS — M25532 Pain in left wrist: Secondary | ICD-10-CM | POA: Diagnosis not present

## 2016-09-08 NOTE — Therapy (Signed)
[place], [place], [address] Phone: [phone]   Fax:  [phone]  Physical Therapy Treatment  Patient Details  Name: Julie Schaefer MRN: [id number] Date of Birth: [birth year] Referring Provider: Dr. Helane Rima  Encounter Date: 09/08/2016      PT End of Session - 09/08/16 0855    Visit Number 18   Number of Visits 29   Date for PT Re-Evaluation 10/16/16   PT Start Time 0806   PT Stop Time 0853   PT Time Calculation (min) 47 min   Activity Tolerance Patient tolerated treatment well;Patient limited by fatigue      Past Medical History:  Diagnosis Date  . Carpal tunnel syndrome   . Depression   . Migraine   . Seasonal allergies   . Sleep apnea     Past Surgical History:  Procedure Laterality Date  . CARPAL TUNNEL RELEASE  2009  . HERNIA REPAIR  1993    There were no vitals filed for this visit.      Subjective Assessment - 09/08/16 0812    Subjective Pt reports she is still unable to get range of motion required to play piano well. She sleeps with brace at night.  She has no pain at rest, but has pain on distal radial side with movement.     Patient Stated Goals move any direction she wants without pain and use Lt UE for what she wants   Currently in Pain? No/denies  up to 4/10 with movement   Pain Score 0-No pain   Pain Location Wrist   Pain Orientation Left            [facility] PT Assessment - 09/08/16 0001      Assessment   Medical Diagnosis Lt radius fx   Referring Provider Dr. Helane Rima   Onset Date/Surgical Date 05/08/16   Hand Dominance Right     ROM / Strength   AROM / PROM / Strength PROM;AROM     AROM   AROM Assessment Site Wrist   Left Wrist Extension 82 Degrees   Left Wrist Flexion 45 Degrees     PROM   PROM Assessment Site Wrist   Right/Left Wrist Left   Left Wrist Extension 92 Degrees   Left Wrist Flexion 58 Degrees     Strength   Right Hand  Lateral Pinch 11 lbs   Left Hand Grip (lbs) 35   Left Hand Lateral Pinch 8 lbs          [facility] Adult PT Treatment/Exercise - 09/08/16 0001      Hand Exercises   Digiticizer medium prohand x 3 min    Other Hand Exercises med handmaster:  finger extension / hand grip x 30 reps    Other Hand Exercises velcro board:  key grip with pronation/supination,  roller brush with wrist flex/ext      Wrist Exercises   Wrist Radial Deviation Strengthening;Left;10 reps   Bar Weights/Barbell (Radial Deviation) 1 lb   Other wrist exercises wrist circles x 40,  AROM wrist flex/ext,  wrist stretches in flex/ ext x 30 sec x 4 reps (Lt).       Modalities   Modalities Moist Heat     Moist Heat Therapy   Number Minutes Moist Heat 8 Minutes   Moist Heat Location Wrist  Lt      Manual Therapy   Joint Mobilization Lt carpal bones AP grade 2-3   Soft tissue mobilization to Lt wrist  to forearm (palmar/dorsal surface) to decrease fascial restrictions, increase ROM and decrease pain/ sensitivity.    Myofascial Release pin and stretch to Lt wrist for wrist flex, ext, radial/ulnar deviation with active motion of hand/wrist.     Passive ROM Lt wrist traction with flexion.     Kinesiotix   Create Space I lift strip applied along thumb extensor tendon (Lt), and along Lt ulna to decompress tissue and decrease pain, facilitate muscle.                       PT Long Term Goals - 09/04/16 0827      PT LONG TERM GOAL #1   Title Improve functional use of Rt UE allowing patient to use Lt UE for ADL's with minimal to no pain  09/04/16/17   Time 18   Period Weeks   Status Achieved     PT LONG TERM GOAL #2   Title Patient reports that she can play the piano with minimal to no pain for 10-15 min 10/16/16   Time 18   Period Weeks   Status Partially Met     PT LONG TERM GOAL #3   Title Prevent decreased AROM Rt shoulder in flex/abd/IR/ER 09/04/16   Time 18   Period Weeks   Status Achieved     PT  LONG TERM GOAL #4   Title Independent in HEP 10/16/16   Time 18   Period Weeks   Status On-going     PT LONG TERM GOAL #5   Title Improve FOTO to </= 30% limitation for Rt shoulder; 27% Rt ankle  08/03/16   Time 12   Period Weeks   Status Achieved     PT LONG TERM GOAL #6   Title Improve Rt ankle ROM to WFL's and =/> Lt ankle 08/03/16   Time 18   Period Weeks   Status Achieved     PT LONG TERM GOAL #7   Title Increase Rt ankle strength to 5-/5 to 5/5 08/03/16   Time 18   Period Weeks   Status Achieved     PT LONG TERM GOAL #8   Title Ambulate with normal gait pattern with minimal to no pain 08/03/16   Time 6   Period Weeks   Status Achieved     PT LONG TERM GOAL  #9   TITLE Improve AROM Lt forearm, wrist and hand to WFL's to allow her to knit per her previous level 10/16/16   Time 18   Period Weeks   Status Revised     PT LONG TERM GOAL  #10   TITLE Improve strength Lt forearm, wrist and hand to allow patient to preform normal functional activities 10/16/16   Time 18   Status On-going  unable to push piano, bear wt, has dexterity challenges with piano.     PT LONG TERM GOAL  #11   TITLE Patient independent in ADL's Lt UE with minimal pain and limitation 09/04/16   Time 18   Status Achieved     PT LONG TERM GOAL  #12   TITLE report ability to push her piano with minimal to no wrist pain ( 10/16/16)    Time 6   Period Weeks   Status New               Plan - 09/08/16 0853    Clinical Impression Statement Slight improvement in Lt wrist PROM.  She fatigues quickly with hand/wrist exercises on  Lt.  Pt reports not having much time to complete HEP on own; encouraged pt to comply with HEP to assist in meeting stated goals.     Rehab Potential Good   PT Frequency 1x / week   PT Duration 6 weeks   PT Treatment/Interventions Patient/family education;ADLs/Self Care Home Management;Cryotherapy;Electrical Stimulation;Iontophoresis 73m/ml Dexamethasone;Moist  Heat;Ultrasound;Neuromuscular re-education;Manual techniques;Dry needling;Therapeutic activities;Therapeutic exercise   PT Next Visit Plan Continue progressive Lt hand/wrist/forearm strengthening.     Consulted and Agree with Plan of Care Patient      Patient will benefit from skilled therapeutic intervention in order to improve the following deficits and impairments:  Postural dysfunction, Improper body mechanics, Pain, Decreased range of motion, Decreased mobility, Decreased strength, Increased fascial restricitons, Increased muscle spasms, Impaired UE functional use, Decreased activity tolerance, Abnormal gait  Visit Diagnosis: Pain in left wrist  Other symptoms and signs involving the musculoskeletal system     Problem List Patient Active Problem List   Diagnosis Date Noted  . Carpal tunnel syndrome, bilateral 06/18/2016  . Closed fracture of left distal radius 05/12/2016  . Impingement syndrome of right shoulder 05/07/2016  . Closed fracture of fifth metatarsal bone of right foot 04/07/2016  . Adjustment disorder with depressed mood 07/07/2012   JKerin Perna PTA 09/08/16 9:02 AM  [place] [address]Phone: [phone]  Fax:  [phone] Name: Julie HickokMRN: [id number] of Birth: [birth year]

## 2016-09-16 ENCOUNTER — Encounter: Payer: Self-pay | Admitting: Rehabilitative and Restorative Service Providers"

## 2016-09-16 ENCOUNTER — Ambulatory Visit (INDEPENDENT_AMBULATORY_CARE_PROVIDER_SITE_OTHER): Payer: BC Managed Care – PPO | Admitting: Rehabilitative and Restorative Service Providers"

## 2016-09-16 DIAGNOSIS — M25532 Pain in left wrist: Secondary | ICD-10-CM | POA: Diagnosis not present

## 2016-09-16 DIAGNOSIS — R29898 Other symptoms and signs involving the musculoskeletal system: Secondary | ICD-10-CM

## 2016-09-16 NOTE — Therapy (Signed)
[facility] [address] [place], [place], [address] Phone: [phone]   Fax:  [phone]  Physical Therapy Treatment  Patient Details  Name: Julie Schaefer MRN: [id number] Date of Birth: 07/03/1971 Referring Provider: Dr. Briant Siteshekkekandem  Encounter Date: 09/16/2016      PT End of Session - 09/16/16 0858    Visit Number 19   Number of Visits 29   Date for PT Re-Evaluation 10/16/16   PT Start Time 0847   PT Stop Time 0932   PT Time Calculation (min) 45 min      Past Medical History:  Diagnosis Date  . Carpal tunnel syndrome   . Depression   . Migraine   . Seasonal allergies   . Sleep apnea     Past Surgical History:  Procedure Laterality Date  . CARPAL TUNNEL RELEASE  2009  . HERNIA REPAIR  1993    There were no vitals filed for this visit.                       [facility] Adult PT Treatment/Exercise - 09/16/16 0001      Wrist Exercises   Other wrist exercises PROM/stretching for forearm; wrist and hand - added passive stretch with arm extended behind back for stretch into wrist flexion and extension - prolonged hold      Moist Heat Therapy   Number Minutes Moist Heat 10 Minutes   Moist Heat Location Wrist  Lt - and forearm     Manual Therapy   Joint Mobilization Lt carpal bones AP grade 2-3; Lt wrist   Soft tissue mobilization Lt forearm flexors/extensors proximal and mid muscle belly; Lt wrist to forearm (palmar/dorsal surface) to decrease fascial restrictions, increase ROM and decrease pain/ sensitivity.    Myofascial Release stretch to Lt wrist for wrist flex, ext, radial/ulnar deviation with active motion of hand/wrist.     Passive ROM Lt wrist traction with flexion; ulnar and radial deviation      Kinesiotix   Create Space I lift strip applied along thumb extensor tendon (Lt), and along Lt ulna to decompress tissue and decrease pain, facilitate muscle.                  PT  Education - 09/16/16 [phone]    Education provided Yes   Education Details prolonged stretch for Lt wrist flexon and extension in sitting with arm/hand behind body   Person(s) Educated Patient   Methods Explanation;Demonstration;Tactile cues;Verbal cues   Comprehension Verbalized understanding;Returned demonstration;Verbal cues required;Tactile cues required             PT Long Term Goals - 09/16/16 0941      PT LONG TERM GOAL #1   Title Improve functional use of Rt UE allowing patient to use Lt UE for ADL's with minimal to no pain  10/16/16   Time 18   Period Weeks   Status On-going     PT LONG TERM GOAL #2   Title Patient reports that she can play the piano with minimal to no pain for 10-15 min 10/16/16   Time 18   Period Weeks   Status On-going     PT LONG TERM GOAL #3   Title Prevent decreased AROM Rt shoulder in flex/abd/IR/ER 09/04/16   Time 18   Period Weeks   Status Achieved     PT LONG TERM GOAL #4   Title Independent in HEP 10/16/16   Time 18  Period Weeks   Status On-going     PT LONG TERM GOAL #5   Title Improve FOTO to </= 30% limitation for Rt shoulder; 27% Rt ankle  08/03/16   Time 12   Period Weeks   Status Achieved               Plan - 09/16/16 0939    Clinical Impression Statement Continued muscular tightness noted through flexor and extensor forearm musculature proximally; tightness and discomfort through the wrist/hand. Gradually progressing with functional activities.    Rehab Potential Good   PT Frequency 1x / week   PT Duration 6 weeks   PT Treatment/Interventions Patient/family education;ADLs/Self Care Home Management;Cryotherapy;Electrical Stimulation;Iontophoresis 4mg /ml Dexamethasone;Moist Heat;Ultrasound;Neuromuscular re-education;Manual techniques;Dry needling;Therapeutic activities;Therapeutic exercise   PT Next Visit Plan Continue progressive Lt hand/wrist/forearm strengthening.     Consulted and Agree with Plan of Care Patient       Patient will benefit from skilled therapeutic intervention in order to improve the following deficits and impairments:     Visit Diagnosis: Pain in left wrist  Other symptoms and signs involving the musculoskeletal system     Problem List Patient Active Problem List   Diagnosis Date Noted  . Carpal tunnel syndrome, bilateral 06/18/2016  . Closed fracture of left distal radius 05/12/2016  . Impingement syndrome of right shoulder 05/07/2016  . Closed fracture of fifth metatarsal bone of right foot 04/07/2016  . Adjustment disorder with depressed mood 07/07/2012    Julie Mccaughey Rober MinionP Tiamarie Furnari PT, MPH  09/16/2016, 9:42 AM  [facility] [address] [place], [place], [address] Phone: [phone]   Fax:  [phone]  Name: Julie LownHelen Schaefer MRN: [id number] Date of Birth: 07/29/1971

## 2016-09-18 ENCOUNTER — Encounter: Payer: Self-pay | Admitting: Sports Medicine

## 2016-09-24 ENCOUNTER — Encounter: Payer: Self-pay | Admitting: Physical Therapy

## 2016-09-30 ENCOUNTER — Ambulatory Visit (INDEPENDENT_AMBULATORY_CARE_PROVIDER_SITE_OTHER): Payer: BC Managed Care – PPO | Admitting: Physical Therapy

## 2016-09-30 DIAGNOSIS — R29898 Other symptoms and signs involving the musculoskeletal system: Secondary | ICD-10-CM

## 2016-09-30 DIAGNOSIS — M25532 Pain in left wrist: Secondary | ICD-10-CM

## 2016-09-30 NOTE — Therapy (Signed)
[place], [place], [address] Phone: [phone]   Fax:  [phone]  Physical Therapy Treatment  Patient Details  Name: Julie Schaefer MRN: [id number] Date of Birth: 12/08/1970 Referring Provider: Dr. Helane Rima  Encounter Date: 09/30/2016      PT End of Session - 09/30/16 1521    Visit Number 20   Number of Visits 29   Date for PT Re-Evaluation 10/16/16   PT Start Time 1519   PT Stop Time 1600   PT Time Calculation (min) 41 min   Activity Tolerance Patient tolerated treatment well   Behavior During Therapy [facility] for tasks assessed/performed      Past Medical History:  Diagnosis Date  . Carpal tunnel syndrome   . Depression   . Migraine   . Seasonal allergies   . Sleep apnea     Past Surgical History:  Procedure Laterality Date  . CARPAL TUNNEL RELEASE  2009  . HERNIA REPAIR  1993    There were no vitals filed for this visit.      Subjective Assessment - 09/30/16 1521    Subjective Pt reports now that she's not taking Meloxicam, she has more pain with playing piano and knitting.    Patient Stated Goals move any direction she wants without pain and use Lt UE for what she wants   Currently in Pain? Yes   Pain Score 3    Pain Location Wrist   Pain Orientation Left   Pain Descriptors / Indicators Sharp  with movement   Aggravating Factors  moving wrist past comfortable point    Pain Relieving Factors rest, rock tape, medicine .            [facility] PT Assessment - 09/30/16 0001      ROM / Strength   AROM / PROM / Strength PROM;Strength     AROM   AROM Assessment Site Wrist   Right/Left Wrist Left   Left Wrist Extension 80 Degrees   Left Wrist Flexion 53 Degrees   Left Wrist Radial Deviation 33 Degrees   Left Wrist Ulnar Deviation 45 Degrees     PROM   Left Wrist Extension 92 Degrees  with pain     Strength   Strength Assessment Site Wrist   Right/Left Wrist Left   Left Wrist Flexion 5/5   Left Wrist Extension 4+/5  with pain   Left Wrist Radial Deviation 5/5  with pain   Left Wrist Ulnar Deviation --  5-/5, painful                     [facility] Adult PT Treatment/Exercise - 09/30/16 0001      Wrist Exercises   Other wrist exercises wrist circles x 40,  AROM wrist flex/ext,  wrist stretches in flex/ ext x 30 sec x 4 reps (Lt).     Other wrist exercises Manual resistance to Lt wrist in pronation/supination, ulnar and radial deviation, wrist ext/flex x 10-12 reps of each;  then passive stretching into Lt ulnar deviation.      Ultrasound   Ultrasound Location Lt wrist (medial / lateral )    Ultrasound Parameters 50%, 1.0 w/cm2, 8 min    Ultrasound Goals Edema;Pain     Manual Therapy   Joint Mobilization Lt carpal bones AP grade 2-3; Lt wrist   Soft tissue mobilization Lt forearm flexors/extensors proximal and mid muscle belly; Lt wrist to forearm (palmar/dorsal surface) to decrease fascial restrictions, increase  ROM and decrease pain/ sensitivity.    Myofascial Release  pin and stretch to Lt wrist for wrist flex, ext, radial/ulnar deviation with active motion of hand/wrist.     Passive ROM Lt wrist traction with flexion; ulnar and radial deviation      Kinesiotix   Create Space I lift strip applied along thumb extensor tendon (Lt), and along Lt ulna to decompress tissue and decrease pain, facilitate muscle.                       PT Long Term Goals - 09/30/16 1535      PT LONG TERM GOAL #1   Title Improve functional use of Rt UE allowing patient to use Lt UE for ADL's with minimal to no pain  10/16/16   Time 18   Period Weeks   Status Achieved     PT LONG TERM GOAL #2   Title Patient reports that she can play the piano with minimal to no pain for 10-15 min 10/16/16   Time 18   Period Weeks   Status Achieved     PT LONG TERM GOAL #3   Title Prevent decreased AROM Rt shoulder in flex/abd/IR/ER 09/04/16   Time 18    Period Weeks   Status Achieved     PT LONG TERM GOAL #4   Title Independent in HEP 10/16/16   Time 18   Period Weeks   Status On-going     PT LONG TERM GOAL #5   Title Improve FOTO to </= 30% limitation for Rt shoulder; 27% Rt ankle  08/03/16   Time 12   Period Weeks   Status Achieved     PT LONG TERM GOAL #6   Title Improve Rt ankle ROM to WFL's and =/> Lt ankle 08/03/16   Time 18   Period Weeks   Status Achieved     PT LONG TERM GOAL #7   Title Increase Rt ankle strength to 5-/5 to 5/5 08/03/16   Time 18   Period Weeks   Status Achieved     PT LONG TERM GOAL #8   Title Ambulate with normal gait pattern with minimal to no pain 08/03/16   Time 6   Period Weeks   Status Achieved     PT LONG TERM GOAL  #9   TITLE Improve AROM Lt forearm, wrist and hand to WFL's to allow her to knit per her previous level 10/16/16   Time 18   Period Weeks   Status On-going     PT LONG TERM GOAL  #10   TITLE Improve strength Lt forearm, wrist and hand to allow patient to preform normal functional activities 10/16/16   Time 18   Period Weeks   Status On-going     PT LONG TERM GOAL  #11   TITLE Patient independent in ADL's Lt UE with minimal pain and limitation 09/04/16   Time 18   Period Weeks   Status Achieved     PT LONG TERM GOAL  #12   TITLE report ability to push her piano with minimal to no wrist pain ( 10/16/16)    Time 6   Period Weeks   Status On-going               Plan - 09/30/16 1704    Clinical Impression Statement Pt has made some gradual gains with Lt wrist ROM. Continued muscular tightness through extensor forearm musculature.  Her strength in Lt  wrist has improved, however MMT was painful.  She has met LTG #2.     Rehab Potential Good   PT Frequency 1x / week   PT Duration 6 weeks   PT Treatment/Interventions Patient/family education;ADLs/Self Care Home Management;Cryotherapy;Electrical Stimulation;Iontophoresis 14m/ml Dexamethasone;Moist  Heat;Ultrasound;Neuromuscular re-education;Manual techniques;Dry needling;Therapeutic activities;Therapeutic exercise   PT Next Visit Plan Continue progressive Lt hand/wrist/forearm strengthening.     Consulted and Agree with Plan of Care Patient      Patient will benefit from skilled therapeutic intervention in order to improve the following deficits and impairments:  Postural dysfunction, Improper body mechanics, Pain, Decreased range of motion, Decreased mobility, Decreased strength, Increased fascial restricitons, Increased muscle spasms, Impaired UE functional use, Decreased activity tolerance, Abnormal gait  Visit Diagnosis: Pain in left wrist  Other symptoms and signs involving the musculoskeletal system     Problem List Patient Active Problem List   Diagnosis Date Noted  . Carpal tunnel syndrome, bilateral 06/18/2016  . Closed fracture of left distal radius 05/12/2016  . Impingement syndrome of right shoulder 05/07/2016  . Closed fracture of fifth metatarsal bone of right foot 04/07/2016  . Adjustment disorder with depressed mood 07/07/2012   JKerin Perna PTA 09/30/16 5:10 PM  [address] [address]: [phone]  Fax:  [phone] Name: Julie SaloMRN: [id number] of Birth: [birth year]

## 2016-10-01 ENCOUNTER — Encounter: Payer: Self-pay | Admitting: Sports Medicine

## 2016-10-01 DIAGNOSIS — M25532 Pain in left wrist: Secondary | ICD-10-CM

## 2016-10-05 ENCOUNTER — Ambulatory Visit (INDEPENDENT_AMBULATORY_CARE_PROVIDER_SITE_OTHER): Payer: BC Managed Care – PPO | Admitting: Physical Therapy

## 2016-10-05 DIAGNOSIS — M25532 Pain in left wrist: Secondary | ICD-10-CM | POA: Diagnosis not present

## 2016-10-05 DIAGNOSIS — R29898 Other symptoms and signs involving the musculoskeletal system: Secondary | ICD-10-CM | POA: Diagnosis not present

## 2016-10-05 NOTE — Therapy (Signed)
[facility] [address] [place], [place], [address] Phone: [phone]   Fax:  [phone]  Physical Therapy Treatment  Patient Details  Name: Julie LownHelen Curvin MRN: [id number] Date of Birth: 11/17/1970 Referring Provider: Dr. Briant Siteshekkekandem  Encounter Date: 10/05/2016      PT End of Session - 10/05/16 0812    Visit Number 21   Number of Visits 29   Date for PT Re-Evaluation 10/16/16   PT Start Time 0721   PT Stop Time 0809   PT Time Calculation (min) 48 min   Activity Tolerance Patient tolerated treatment well   Behavior During Therapy [facility] for tasks assessed/performed      Past Medical History:  Diagnosis Date  . Carpal tunnel syndrome   . Depression   . Migraine   . Seasonal allergies   . Sleep apnea     Past Surgical History:  Procedure Laterality Date  . CARPAL TUNNEL RELEASE  2009  . HERNIA REPAIR  1993    There were no vitals filed for this visit.      Subjective Assessment - 10/05/16 0729    Subjective Pt did a lot of crocheting this weekend; she is a little more sore today.  She has contacted MD regarding continued pain. She is now scheduled for MRI of wrist.  She injured her back at work on Thursday.     Currently in Pain? Yes  0/10 at rest, 2-3/10 with movement.   Pain Score 2    Pain Location Wrist   Pain Orientation Left   Pain Descriptors / Indicators Sharp   Aggravating Factors  moving wrist past comfortable point    Pain Relieving Factors rest, rock tape, medicine            [facility] PT Assessment - 10/05/16 0001      AROM   Left Wrist Extension 84 Degrees   Left Wrist Radial Deviation 33 Degrees   Left Wrist Ulnar Deviation 45 Degrees          [facility] Adult PT Treatment/Exercise - 10/05/16 0001      Hand Exercises   Digiticizer medium prohand x 3 min    Other Hand Exercises velcro board:  key grip with pronation/supination,  roller brush with wrist flex/ext      Wrist Exercises    Bar Weights/Barbell (Forearm Supination) 2 lbs  12 reps   Bar Weights/Barbell (Forearm Pronation) 2 lbs  12 reps   Wrist Radial Deviation Strengthening;Left;10 reps;Seated   Bar Weights/Barbell (Radial Deviation) 2 lbs  12 reps   Other wrist exercises wrist flexion/ ext with 2# x 12 reps each direction   Other wrist exercises wrist flex/ext stretch x 30 sec x 2 reps each     Moist Heat Therapy   Number Minutes Moist Heat --  throughout treatment    Moist Heat Location Lumbar Spine     Ultrasound   Ultrasound Location Lt wrist (radial side) and mid forearm    Ultrasound Parameters 100%, 1.1 w/cm2, 8 min    Ultrasound Goals Pain;Other (Comment)  tightness      Manual Therapy   Joint Mobilization Lt carpal bones AP grade 2-3; Lt wrist   Soft tissue mobilization Lt forearm flexors/extensors proximal and mid muscle belly; Lt wrist to forearm (palmar/dorsal surface) to decrease fascial restrictions, increase ROM and decrease pain/ sensitivity.    Myofascial Release  pin and stretch to Lt wrist for wrist flex, ext, radial/ulnar deviation with active motion of hand/wrist.  Passive ROM Lt wrist traction with flexion; ulnar and radial deviation    Kinesiotex Facilitate Muscle     Kinesiotix   Create Space (tape still in place on ulnar side of Lt forearm. )   Facilitate Muscle  I strip applied with 15% stretch to Lt thumb abductor/extensors                      PT Long Term Goals - 09/30/16 1535      PT LONG TERM GOAL #1   Title Improve functional use of Rt UE allowing patient to use Lt UE for ADL's with minimal to no pain  10/16/16   Time 18   Period Weeks   Status Achieved     PT LONG TERM GOAL #2   Title Patient reports that she can play the piano with minimal to no pain for 10-15 min 10/16/16   Time 18   Period Weeks   Status Achieved     PT LONG TERM GOAL #3   Title Prevent decreased AROM Rt shoulder in flex/abd/IR/ER 09/04/16   Time 18   Period Weeks    Status Achieved     PT LONG TERM GOAL #4   Title Independent in HEP 10/16/16   Time 18   Period Weeks   Status On-going     PT LONG TERM GOAL #5   Title Improve FOTO to </= 30% limitation for Rt shoulder; 27% Rt ankle  08/03/16   Time 12   Period Weeks   Status Achieved     PT LONG TERM GOAL #6   Title Improve Rt ankle ROM to WFL's and =/> Lt ankle 08/03/16   Time 18   Period Weeks   Status Achieved     PT LONG TERM GOAL #7   Title Increase Rt ankle strength to 5-/5 to 5/5 08/03/16   Time 18   Period Weeks   Status Achieved     PT LONG TERM GOAL #8   Title Ambulate with normal gait pattern with minimal to no pain 08/03/16   Time 6   Period Weeks   Status Achieved     PT LONG TERM GOAL  #9   TITLE Improve AROM Lt forearm, wrist and hand to WFL's to allow her to knit per her previous level 10/16/16   Time 18   Period Weeks   Status On-going     PT LONG TERM GOAL  #10   TITLE Improve strength Lt forearm, wrist and hand to allow patient to preform normal functional activities 10/16/16   Time 18   Period Weeks   Status On-going     PT LONG TERM GOAL  #11   TITLE Patient independent in ADL's Lt UE with minimal pain and limitation 09/04/16   Time 18   Period Weeks   Status Achieved     PT LONG TERM GOAL  #12   TITLE report ability to push her piano with minimal to no wrist pain ( 10/16/16)    Time 6   Period Weeks   Status On-going               Plan - 10/05/16 [id number]    Clinical Impression Statement Continued gradual gains with Lt wrist ROM, improvement noted in wrist extension.  She was able to tolerate increased resistance with wrist exercises. Making gradual progress towards remaining goals.  Pt will benefit from contiued PT intervention to maximize functional mobility.    Rehab  Potential Good   PT Frequency 1x / week   PT Duration 6 weeks   PT Treatment/Interventions Patient/family education;ADLs/Self Care Home Management;Cryotherapy;Electrical  Stimulation;Iontophoresis 4mg /ml Dexamethasone;Moist Heat;Ultrasound;Neuromuscular re-education;Manual techniques;Dry needling;Therapeutic activities;Therapeutic exercise   PT Next Visit Plan Continue progressive Lt hand/wrist/forearm strengthening.     Consulted and Agree with Plan of Care Patient      Patient will benefit from skilled therapeutic intervention in order to improve the following deficits and impairments:  Postural dysfunction, Improper body mechanics, Pain, Decreased range of motion, Decreased mobility, Decreased strength, Increased fascial restricitons, Increased muscle spasms, Impaired UE functional use, Decreased activity tolerance, Abnormal gait  Visit Diagnosis: Pain in left wrist  Other symptoms and signs involving the musculoskeletal system     Problem List Patient Active Problem List   Diagnosis Date Noted  . Carpal tunnel syndrome, bilateral 06/18/2016  . Closed fracture of left distal radius 05/12/2016  . Impingement syndrome of right shoulder 05/07/2016  . Closed fracture of fifth metatarsal bone of right foot 04/07/2016  . Adjustment disorder with depressed mood 07/07/2012   Mayer Camel, PTA 10/05/16 8:28 AM  [facility] [place] [address] [place], [place], [address] Phone: [phone]   Fax:  [phone]  Name: Miata Culbreth MRN: [id number] Date of Birth: 04-15-1971

## 2016-10-08 ENCOUNTER — Ambulatory Visit (INDEPENDENT_AMBULATORY_CARE_PROVIDER_SITE_OTHER): Payer: BC Managed Care – PPO

## 2016-10-08 DIAGNOSIS — S52502K Unspecified fracture of the lower end of left radius, subsequent encounter for closed fracture with nonunion: Secondary | ICD-10-CM

## 2016-10-08 DIAGNOSIS — W19XXXD Unspecified fall, subsequent encounter: Secondary | ICD-10-CM | POA: Diagnosis not present

## 2016-10-08 DIAGNOSIS — M25532 Pain in left wrist: Secondary | ICD-10-CM

## 2016-10-12 ENCOUNTER — Encounter: Payer: Self-pay | Admitting: Sports Medicine

## 2016-10-16 ENCOUNTER — Encounter: Payer: Self-pay | Admitting: Sports Medicine

## 2016-10-16 ENCOUNTER — Ambulatory Visit (INDEPENDENT_AMBULATORY_CARE_PROVIDER_SITE_OTHER): Payer: BC Managed Care – PPO | Admitting: Rehabilitative and Restorative Service Providers"

## 2016-10-16 ENCOUNTER — Encounter: Payer: Self-pay | Admitting: Rehabilitative and Restorative Service Providers"

## 2016-10-16 ENCOUNTER — Ambulatory Visit (INDEPENDENT_AMBULATORY_CARE_PROVIDER_SITE_OTHER): Payer: BC Managed Care – PPO | Admitting: Sports Medicine

## 2016-10-16 DIAGNOSIS — S52532G Colles' fracture of left radius, subsequent encounter for closed fracture with delayed healing: Secondary | ICD-10-CM | POA: Diagnosis not present

## 2016-10-16 DIAGNOSIS — R29898 Other symptoms and signs involving the musculoskeletal system: Secondary | ICD-10-CM | POA: Diagnosis not present

## 2016-10-16 DIAGNOSIS — M25532 Pain in left wrist: Secondary | ICD-10-CM

## 2016-10-16 NOTE — Assessment & Plan Note (Addendum)
Roughly 6 months post distal radius fracture, doesn't have much pain over the distal radial fracture itself however MRI did show some delayed union. She did have what appeared to be a TFCC tear. Injection around the TFCC disc today. Return to see me in one month. She will probably need referral for wrist arthroscopy if persistent pain after this injection.

## 2016-10-16 NOTE — Progress Notes (Signed)
  Subjective:    CC: Follow-up  HPI: 6 months ago we did a closed reduction of the distal radius fracture on this pleasant 45 year old female, she has continued to have pain localized just past the ulnar styloid process. 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 Distal radius fracture itself also appeared partially healed. Pain is moderate, persistent and she is agreeable to proceed with interventional treatment today.  Past medical history:  Negative.  See flowsheet/record as well for more information.  Surgical history: Negative.  See flowsheet/record as well for more information.  Family history: Negative.  See flowsheet/record as well for more information.  Social history: Negative.  See flowsheet/record as well for more information.  Allergies, and medications have been entered into the medical record, reviewed, and no changes needed.   Review of Systems: No fevers, chills, night sweats, weight loss, chest pain, or shortness of breath.   Objective:    General: Well Developed, well nourished, and in no acute distress.  Neuro: Alert and oriented x3, extra-ocular muscles intact, sensation grossly intact.  HEENT: Normocephalic, atraumatic, pupils equal round reactive to light, neck supple, no masses, no lymphadenopathy, thyroid nonpalpable.  Skin: Warm and dry, no rashes. Cardiac: Regular rate and rhythm, no murmurs rubs or gallops, no lower extremity edema.  Respiratory: Clear to auscultation bilaterally. Not using accessory muscles, speaking in full sentences. Left Wrist: Inspection normal with no visible erythema or swelling. ROM smooth and normal with good flexion and extension and ulnar/radial deviation that is symmetrical with opposite wrist.  Tender to palpation with terminal ulnar deviation of the wrist Palpation is normal over metacarpals, navicular,  lunate, and TFCC; tendons without tenderness/ swelling No snuffbox tenderness. No tenderness over Canal of [place]. Strength 5/5 in all directions without pain. Negative Finkelstein, tinel's and phalens. Negative Watson's test.  Procedure: Real-time Ultrasound Guided Injection of left TFCC Device: GE Logiq E  Verbal informed consent obtained.  Time-out conducted.  Noted no overlying erythema, induration, or other signs of local infection.  Skin prepped in a sterile fashion.  Local anesthesia: Topical Ethyl chloride.  With sterile technique and under real time ultrasound guidance:  25-gauge needle advanced just distal to the ulnar styloid process and 1/2 mL kenalog 40, 1/2 mL lidocaine injected around the TFCC disc.  Completed without difficulty  Pain immediately resolved suggesting accurate placement of the medication.  Advised to call if fevers/chills, erythema, induration, drainage, or persistent bleeding.  Images permanently stored and available for review in the ultrasound unit.  Impression: Technically successful ultrasound guided injection.  Impression and Recommendations:    Closed fracture of left distal radius Roughly 6 months post distal radius fracture, doesn't have much pain over the distal radial fracture itself however MRI did show some delayed union. She did have what appeared to be a TFCC tear. Injection around the TFCC disc today. Return to see me in one month. She will probably need referral for wrist arthroscopy if persistent pain after this injection.

## 2016-10-16 NOTE — Therapy (Signed)
[facility] [address] [facility] [place], [place], [address] Phone: [phone]   Fax:  [phone]  Physical Therapy Treatment  Patient Details  Name: Julie LownHelen Schaefer MRN: [id number] Date of Birth: 08/11/1971 Referring Provider: Dr. Briant Siteshekkekandem  Encounter Date: 10/16/2016      PT End of Session - 10/16/16 0826    Visit Number 22   Number of Visits 30   Date for PT Re-Evaluation 11/27/16   PT Start Time 0805   PT Stop Time 0900   PT Time Calculation (min) 55 min   Activity Tolerance Patient tolerated treatment well      Past Medical History:  Diagnosis Date  . Carpal tunnel syndrome   . Depression   . Migraine   . Seasonal allergies   . Sleep apnea     Past Surgical History:  Procedure Laterality Date  . CARPAL TUNNEL RELEASE  2009  . HERNIA REPAIR  1993    There were no vitals filed for this visit.      Subjective Assessment - 10/16/16 0827    Subjective MRI shows partial healing. Continued pain and discomfort in the Lt wrist. Sees Dr T this am. Still has pain with prolonged activities with Lt wrist such as knitting or playing the piano. Unable to bear weight on Lt wrist.    Currently in Pain? Yes   Pain Score 3   with movement    Pain Location Wrist   Pain Orientation Left   Pain Descriptors / Indicators Sharp   Pain Type Chronic pain   Pain Onset More than a month ago   Pain Frequency Intermittent   Aggravating Factors  moving             [facility] - 10/16/16 0001      Strength   Strength Assessment Site Wrist   Right/Left Wrist Left   Left Wrist Flexion 5/5   Left Wrist Extension 4+/5  painful    Left Wrist Radial Deviation 5/5  painful   Left Wrist Ulnar Deviation --  5-/5 painful                     [facility] Adult PT Treatment/Exercise - 10/16/16 0001      Hand Exercises   Digiticizer medium prohand x 3 min    Other Hand Exercises velcro board:  key grip with  pronation/supination,  roller brush with wrist flex/ext      Wrist Exercises   Bar Weights/Barbell (Forearm Supination) 2 lbs  12 reps   Bar Weights/Barbell (Forearm Pronation) 2 lbs  12 reps   Wrist Radial Deviation Strengthening;Left;10 reps;Seated   Bar Weights/Barbell (Radial Deviation) 2 lbs  12 reps   Other wrist exercises wrist flexion/ ext with 2# x 12 reps each direction   Other wrist exercises wrist flex/ext stretch x 30 sec x 2 reps each     Ultrasound   Ultrasound Location Lt wrist ratial aspect   Ultrasound Parameters 100%; 1.2. w/cm2; 8 min    Ultrasound Goals Pain;Other (Comment)  tightness      Manual Therapy   Joint Mobilization Lt carpal bones AP grade 2-3; Lt wrist   Soft tissue mobilization Lt forearm flexors/extensors proximal and mid muscle belly; Lt wrist to forearm (palmar/dorsal surface) to decrease fascial restrictions, increase ROM and decrease pain/ sensitivity.    Myofascial Release  pin and stretch to Lt wrist for wrist flex, ext, radial/ulnar deviation with active motion of hand/wrist.  Passive ROM Lt wrist traction with flexion; ulnar and radial deviation                      PT Long Term Goals - 10/16/16 0828      PT LONG TERM GOAL #4   Title Independent in HEP 11/27/16   Time 24   Period Weeks   Status Revised     PT LONG TERM GOAL  #9   TITLE Improve AROM Lt forearm, wrist and hand to WFL's to allow her to knit per her previous level 11/27/16   Time 24   Period Weeks   Status Revised     PT LONG TERM GOAL  #10   TITLE Improve strength Lt forearm, wrist and hand to allow patient to preform normal functional activities 11/27/16   Time 24   Period Weeks   Status Revised     PT LONG TERM GOAL  #12   TITLE report ability to push her piano with minimal to no wrist pain 11/27/16   Time 12   Period Weeks   Status Revised               Plan - 10/16/16 0835    Clinical Impression Statement continued pain with functional  activities. Responds well to manual work and modalities with decrease in pain. Taping helps support wrist and improve function. May benefit from continued therapy one time/wk depending on additional intervention.    Rehab Potential Good   PT Frequency 1x / week   PT Duration 6 weeks   PT Treatment/Interventions Patient/family education;ADLs/Self Care Home Management;Cryotherapy;Electrical Stimulation;Iontophoresis 4mg /ml Dexamethasone;Moist Heat;Ultrasound;Neuromuscular re-education;Manual techniques;Dry needling;Therapeutic activities;Therapeutic exercise   PT Next Visit Plan Continue progressive Lt hand/wrist/forearm strengthening.     Consulted and Agree with Plan of Care Patient      Patient will benefit from skilled therapeutic intervention in order to improve the following deficits and impairments:  Postural dysfunction, Improper body mechanics, Pain, Decreased range of motion, Decreased mobility, Decreased strength, Increased fascial restricitons, Increased muscle spasms, Impaired UE functional use, Decreased activity tolerance, Abnormal gait  Visit Diagnosis: Pain in left wrist - Plan: PT plan of care cert/re-cert  Other symptoms and signs involving the musculoskeletal system - Plan: PT plan of care cert/re-cert     Problem List Patient Active Problem List   Diagnosis Date Noted  . Carpal tunnel syndrome, bilateral 06/18/2016  . Closed fracture of left distal radius 05/12/2016  . Impingement syndrome of right shoulder 05/07/2016  . Closed fracture of fifth metatarsal bone of right foot 04/07/2016  . Adjustment disorder with depressed mood 07/07/2012    Delcie Ruppert Rober MinionP Rinaldo Macqueen PT, MPH  10/16/2016, 8:40 AM  [facility] [address] [place], [place], [address] Phone: [phone]   Fax:  [phone]  Name: Julie Schaefer MRN: [id number] Date of Birth: 03/18/1971

## 2016-10-20 ENCOUNTER — Encounter: Payer: Self-pay | Admitting: Sports Medicine

## 2016-10-28 ENCOUNTER — Ambulatory Visit (INDEPENDENT_AMBULATORY_CARE_PROVIDER_SITE_OTHER): Payer: BC Managed Care – PPO | Admitting: Physical Therapy

## 2016-10-28 DIAGNOSIS — R29898 Other symptoms and signs involving the musculoskeletal system: Secondary | ICD-10-CM

## 2016-10-28 DIAGNOSIS — M25532 Pain in left wrist: Secondary | ICD-10-CM

## 2016-10-28 NOTE — Therapy (Addendum)
[place] [address], [place], [address] Phone: [phone]   Fax:  [phone]  Physical Therapy Treatment  Patient Details  Name: Julie Schaefer MRN: [id number] Date of Birth: 02-24-1971 Referring Provider: Dr. Helane Rima  Encounter Date: 10/28/2016      PT End of Session - 10/28/16 1633    Visit Number 23   Number of Visits 30   Date for PT Re-Evaluation 11/27/16   PT Start Time 1735   PT Stop Time 1645   PT Time Calculation (min) 40 min   Activity Tolerance Patient tolerated treatment well   Behavior During Therapy [facility] for tasks assessed/performed      Past Medical History:  Diagnosis Date  . Carpal tunnel syndrome   . Depression   . Migraine   . Seasonal allergies   . Sleep apnea     Past Surgical History:  Procedure Laterality Date  . CARPAL TUNNEL RELEASE  2009  . HERNIA REPAIR  1993    There were no vitals filed for this visit.      Subjective Assessment - 10/28/16 1609    Subjective Pt has had improved mobility and decreased pain since injection from Dr. Dwana Melena.  She only has pain in her Lt wrist (up to 6/70) with certain motions, painfree at rest.    Currently in Pain? No/denies   Pain Score 0-No pain            [facility] PT Assessment - 10/28/16 0001      AROM   Left Wrist Extension 90 Degrees   Left Wrist Flexion 60 Degrees   Left Wrist Radial Deviation 33 Degrees   Left Wrist Ulnar Deviation 45 Degrees     Strength   Right/Left Wrist Left   Left Wrist Flexion 5/5   Left Wrist Extension 5/5   Left Wrist Radial Deviation 5/5   Left Wrist Ulnar Deviation 5/5   Right Hand Grip (lbs) 75   Right Hand 3 Point Pinch 11 lbs   Left Hand Grip (lbs) 53   Left Hand 3 Point Pinch 10 lbs          [facility] Adult PT Treatment/Exercise - 10/28/16 0001      Exercises   Exercises Hand;Wrist     Hand Exercises   Other Hand Exercises wrist circles x 20 each direction.     Other Hand Exercises velcro board:   roller brush with wrist flex/ext      Wrist Exercises   Other wrist exercises wrist flex/ext stretch x 30 sec x 2 reps each     Manual Therapy   Soft tissue mobilization Edge tool to Lt forearm flexors/extensors proximal and mid muscle belly; Lt wrist to forearm (palmar/dorsal surface) to decrease fascial restrictions, increase ROM and decrease pain/ sensitivity.    Myofascial Release  pin and stretch to Lt wrist for wrist flex, ext, radial/ulnar deviation with active motion of hand/wrist.     Passive ROM Lt wrist traction with flexion; ulnar and radial deviation      Kinesiotix   Create Space I strip of Rock tape applied to Lt wrist flexors, with lift strips applied at mid belly to decompress tissue and decrease pain.                      PT Long Term Goals - 10/28/16 1648      PT LONG TERM GOAL #1   Title Improve functional use of Rt UE allowing  patient to use Lt UE for ADL's with minimal to no pain  10/16/16   Time 18   Period Weeks   Status Achieved     PT LONG TERM GOAL #2   Title Patient reports that she can play the piano with minimal to no pain for 10-15 min 10/16/16   Time 18   Period Weeks   Status Achieved     PT LONG TERM GOAL #3   Title Prevent decreased AROM Rt shoulder in flex/abd/IR/ER 09/04/16   Time 18   Period Weeks   Status Achieved     PT LONG TERM GOAL #4   Title Independent in HEP 11/27/16   Time 24   Period Weeks   Status Achieved     PT LONG TERM GOAL #5   Title Improve FOTO to </= 30% limitation for Rt shoulder; 27% Rt ankle  08/03/16   Time 12   Period Weeks   Status Achieved     PT LONG TERM GOAL #6   Title Improve Rt ankle ROM to WFL's and =/> Lt ankle 08/03/16   Time 18   Period Weeks   Status Achieved     PT LONG TERM GOAL #7   Title Increase Rt ankle strength to 5-/5 to 5/5 08/03/16   Time 18   Period Weeks   Status Achieved     PT LONG TERM GOAL #8   Title Ambulate with normal  gait pattern with minimal to no pain 08/03/16   Time 6   Period Weeks   Status Achieved     PT LONG TERM GOAL  #9   TITLE Improve AROM Lt forearm, wrist and hand to WFL's to allow her to knit per her previous level 11/27/16   Time 24   Period Weeks   Status Achieved     PT LONG TERM GOAL  #10   TITLE Improve strength Lt forearm, wrist and hand to allow patient to perform normal functional activities 11/27/16   Time 24   Period Weeks   Status Achieved     PT LONG TERM GOAL  #11   TITLE Patient independent in ADL's Lt UE with minimal pain and limitation 09/04/16   Time 18   Period Weeks   Status Achieved     PT LONG TERM GOAL  #12   TITLE report ability to push her piano with minimal to no wrist pain 11/27/16   Time 12   Period Weeks   Status Achieved               Plan - 10/28/16 1649    Clinical Impression Statement Pt demonstrated improved Lt wrist ROM and strength.  She had some tenderness in proximal Lt wrist flexors with manual therapy.  She has met her goals and is agreeable to d/c to HEP at this time.    Rehab Potential Good   PT Frequency 1x / week   PT Duration 6 weeks   PT Treatment/Interventions Patient/family education;ADLs/Self Care Home Management;Cryotherapy;Electrical Stimulation;Iontophoresis 33m/ml Dexamethasone;Moist Heat;Ultrasound;Neuromuscular re-education;Manual techniques;Dry needling;Therapeutic activities;Therapeutic exercise   PT Next Visit Plan Spoke to supervising PT; will d/c to HEP.    Consulted and Agree with Plan of Care Patient      Patient will benefit from skilled therapeutic intervention in order to improve the following deficits and impairments:  Postural dysfunction, Improper body mechanics, Pain, Decreased range of motion, Decreased mobility, Decreased strength, Increased fascial restricitons, Increased muscle spasms, Impaired UE functional use, Decreased activity  tolerance, Abnormal gait  Visit Diagnosis: Pain in left wrist  Other  symptoms and signs involving the musculoskeletal system     Problem List Patient Active Problem List   Diagnosis Date Noted  . Carpal tunnel syndrome, bilateral 06/18/2016  . Closed fracture of left distal radius 05/12/2016  . Impingement syndrome of right shoulder 05/07/2016  . Closed fracture of fifth metatarsal bone of right foot 04/07/2016  . Adjustment disorder with depressed mood 07/07/2012   Kerin Perna, PTA 10/28/16 4:56 PM  [place], [place], [address] Phone: [phone]   Fax:  [phone]  Name: Julie Schaefer MRN: [id number] Date of Birth: [birth year]  PHYSICAL THERAPY DISCHARGE SUMMARY  Visits from Start of Care: 23  Current functional level related to goals / functional outcomes: Good progress with rehab. See last PT note for discharge status.    Remaining deficits: Intermittent symptoms. Should continue with HEP    Education / Equipment: HEP Plan: Patient agrees to discharge.  Patient goals were met. Patient is being discharged due to meeting the stated rehab goals.  ?????    Celyn P. Helene Kelp PT, MPH 10/29/16 2:43 PM

## 2016-11-13 ENCOUNTER — Encounter: Payer: Self-pay | Admitting: Sports Medicine

## 2016-11-13 ENCOUNTER — Ambulatory Visit (INDEPENDENT_AMBULATORY_CARE_PROVIDER_SITE_OTHER): Payer: BC Managed Care – PPO | Admitting: Sports Medicine

## 2016-11-13 ENCOUNTER — Ambulatory Visit: Payer: Self-pay | Admitting: Sports Medicine

## 2016-11-13 DIAGNOSIS — S52532G Colles' fracture of left radius, subsequent encounter for closed fracture with delayed healing: Secondary | ICD-10-CM

## 2016-11-13 NOTE — Progress Notes (Signed)
  Subjective:    CC: Follow-up  HPI: This is a pleasant 46 year old female, I reduced a fairly severe distal radius fracture approximately 7 months ago, she had persistent pain at the tip of the ulna, ultimately an MRI showed a TFCC tear, this was injected at the last visit and she returns essentially pain-free today.  Past medical history:  Negative.  See flowsheet/record as well for more information.  Surgical history: Negative.  See flowsheet/record as well for more information.  Family history: Negative.  See flowsheet/record as well for more information.  Social history: Negative.  See flowsheet/record as well for more information.  Allergies, and medications have been entered into the medical record, reviewed, and no changes needed.   Review of Systems: No fevers, chills, night sweats, weight loss, chest pain, or shortness of breath.   Objective:    General: Well Developed, well nourished, and in no acute distress.  Neuro: Alert and oriented x3, extra-ocular muscles intact, sensation grossly intact.  HEENT: Normocephalic, atraumatic, pupils equal round reactive to light, neck supple, no masses, no lymphadenopathy, thyroid nonpalpable.  Skin: Warm and dry, no rashes. Cardiac: Regular rate and rhythm, no murmurs rubs or gallops, no lower extremity edema.  Respiratory: Clear to auscultation bilaterally. Not using accessory muscles, speaking in full sentences. Left Wrist: Inspection normal with no visible erythema or swelling. ROM smooth and normal with good flexion and extension and ulnar/radial deviation that is symmetrical with opposite wrist. Palpation is normal over metacarpals, navicular, lunate, and TFCC; tendons without tenderness/ swelling No snuffbox tenderness. No tenderness over Canal of Guyon. Strength 5/5 in all directions without pain. Negative Finkelstein, tinel's and phalens. Negative Watson's test.  Impression and Recommendations:    Closed fracture of left  distal radius Approximately 7 months now post distal radius fracture, we reduced it here in the office. 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 She did have what appears to be a TFCC tear, I injected around her TFCC disc at the last visit she returns today nearly pain-free. She has been graduated to home exercise program, she is gaining dexterity with playing the piano, her movement is not quite the same as it was before so she is simply having to learn new muscle memory. She can return to see me on an as-needed basis however if persistent pain we will get a second opinion from hand surgery.

## 2016-11-13 NOTE — Assessment & Plan Note (Signed)
Approximately 7 months now post distal radius fracture, we reduced it here in the office. 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 She did have what appears to be a TFCC tear, I injected around her TFCC disc at the last visit she returns today nearly pain-free. She has been graduated to home exercise program, she is gaining dexterity with playing the piano, her movement is not quite the same as it was before so she is simply having to learn new muscle memory. She can return to see me on an as-needed basis however if persistent pain we will get a second opinion from hand surgery.

## 2016-11-16 ENCOUNTER — Ambulatory Visit: Payer: Self-pay | Admitting: Sports Medicine

## 2016-12-31 ENCOUNTER — Encounter: Payer: Self-pay | Admitting: Sports Medicine

## 2017-03-24 ENCOUNTER — Ambulatory Visit (INDEPENDENT_AMBULATORY_CARE_PROVIDER_SITE_OTHER): Payer: BC Managed Care – PPO | Admitting: Sports Medicine

## 2017-03-24 ENCOUNTER — Ambulatory Visit (INDEPENDENT_AMBULATORY_CARE_PROVIDER_SITE_OTHER): Payer: BC Managed Care – PPO

## 2017-03-24 ENCOUNTER — Encounter: Payer: Self-pay | Admitting: Sports Medicine

## 2017-03-24 DIAGNOSIS — S52552S Other extraarticular fracture of lower end of left radius, sequela: Secondary | ICD-10-CM

## 2017-03-24 DIAGNOSIS — M79644 Pain in right finger(s): Secondary | ICD-10-CM | POA: Diagnosis not present

## 2017-03-24 DIAGNOSIS — X58XXXS Exposure to other specified factors, sequela: Secondary | ICD-10-CM

## 2017-03-24 DIAGNOSIS — M25531 Pain in right wrist: Secondary | ICD-10-CM

## 2017-03-24 DIAGNOSIS — S52502S Unspecified fracture of the lower end of left radius, sequela: Secondary | ICD-10-CM | POA: Diagnosis not present

## 2017-03-24 MED ORDER — MELOXICAM 15 MG PO TABS: ORAL_TABLET | ORAL | 3 refills | Status: AC

## 2017-03-24 NOTE — Progress Notes (Signed)
Subjective:    CC: Bilateral hand and wrist pain  HPI: Left wrist pain: Last year sometime had a follow-up to an outstretched hand with distal radius fracture, I reduce this fracture, overall the distal radial component did well but she continued to have pain at the ulnar styloid. An MRI arthrogram showed a tear of the TFCC, this was injected and she is now pain-free. Unfortunately over the past few weeks she developed a new one set pain on the dorsum of the radiocarpal joint, worse with terminal extension and flexion of the wrist with mild swelling, no mechanical symptoms.  Right hand pain: Localized at the thumb basal joint, moderate, persistent without radiation.  Past medical history:  Negative.  See flowsheet/record as well for more information.  Surgical history: Negative.  See flowsheet/record as well for more information.  Family history: Negative.  See flowsheet/record as well for more information.  Social history: Negative.  See flowsheet/record as well for more information.  Allergies, and medications have been entered into the medical record, reviewed, and no changes needed.   Review of Systems: No fevers, chills, night sweats, weight loss, chest pain, or shortness of breath.   Objective:    General: Well Developed, well nourished, and in no acute distress.  Neuro: Alert and oriented x3, extra-ocular muscles intact, sensation grossly intact.  HEENT: Normocephalic, atraumatic, pupils equal round reactive to light, neck supple, no masses, no lymphadenopathy, thyroid nonpalpable.  Skin: Warm and dry, no rashes. Cardiac: Regular rate and rhythm, no murmurs rubs or gallops, no lower extremity edema.  Respiratory: Clear to auscultation bilaterally. Not using accessory muscles, speaking in full sentences. Left Wrist: Inspection normal with no visible erythema or swelling. ROM smooth and normal with good flexion and extension and ulnar/radial deviation that is symmetrical with  opposite wrist. There is a production of pain with terminal extension of the wrist Palpation is normal over metacarpals, navicular, lunate, and TFCC; tendons without tenderness/ swelling No snuffbox tenderness. No tenderness over Canal of Guyon. Strength 5/5 in all directions without pain. Negative tinel's and phalens signs. Negative Finkelstein sign. Negative Watson's test. Right Wrist: Inspection normal with no visible erythema or swelling. ROM smooth and normal with good flexion and extension and ulnar/radial deviation that is symmetrical with opposite wrist. Palpation is normal over metacarpals, navicular, lunate, and TFCC; tendons without tenderness/ swelling No snuffbox tenderness. No tenderness over Canal of Guyon. Strength 5/5 in all directions without pain. Negative tinel's and phalens signs. Negative Finkelstein sign. Negative Watson's test. Tender to palpation at the thumb basal joint  Procedure: Real-time Ultrasound Guided Injection of left radiocarpal joint Device: GE Logiq E  Verbal informed consent obtained.  Time-out conducted.  Noted no overlying erythema, induration, or other signs of local infection.  Skin prepped in a sterile fashion.  Local anesthesia: Topical Ethyl chloride.  With sterile technique and under real time ultrasound guidance:  Noted radiocarpal effusion, 25-gauge needle advanced into the joint and 1 mL Kenalog 40, 1 mL lidocaine, 1 mL bupivacaine injected. Completed without difficulty  Pain immediately resolved suggesting accurate placement of the medication.  Advised to call if fevers/chills, erythema, induration, drainage, or persistent bleeding.  Images permanently stored and available for review in the ultrasound unit.  Impression: Technically successful ultrasound guided injection.  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 Radiocarpal joint effusion,  injected today. Repeat x-rays. Meloxicam. Return in one month.  Right wrist pain Suspect thumb basal  joint and generator. Meloxicam, thumb spica brace.  Return in one month, injection if no better. X-rays.

## 2017-03-24 NOTE — Assessment & Plan Note (Signed)
Suspect thumb basal joint and generator. Meloxicam, thumb spica brace.  Return in one month, injection if no better. X-rays.

## 2017-03-24 NOTE — Assessment & Plan Note (Signed)
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 Radiocarpal joint effusion, injected today. Repeat x-rays. Meloxicam. Return in one month.

## 2017-03-26 ENCOUNTER — Encounter: Payer: Self-pay | Admitting: Sports Medicine

## 2017-04-21 ENCOUNTER — Ambulatory Visit (INDEPENDENT_AMBULATORY_CARE_PROVIDER_SITE_OTHER): Payer: BC Managed Care – PPO | Admitting: Sports Medicine

## 2017-04-21 DIAGNOSIS — S52552S Other extraarticular fracture of lower end of left radius, sequela: Secondary | ICD-10-CM | POA: Diagnosis not present

## 2017-04-21 NOTE — Assessment & Plan Note (Signed)
Persistent pain over the dorsal radius after closed reduction approximately a year ago, we had good reduction of fragments but over the next few months there was some settling of the radius with increased ulnar variance. We have tried there is forms of mobilization, pain control, therapy without much improvement. Subsequent MRIs including arthrogram showed injury to the TFCC which responded well to injection, but persistent and incomplete healing of the distal radius fracture. At the last visit she did have a radiocarpal joint effusion on ultrasound, this was injected as well and she had some relief but continues to have pain over the distal radius but not so much over the radiocarpal joint itself. Considering MRI findings this likely represents a malunion and I would like her to touch base with hand surgery.

## 2017-04-21 NOTE — Progress Notes (Signed)
  Subjective:    CC: Follow-up  HPI: Right thumb pain: Resolved with conservative measures.  Left wrist pain: History of distal radius fracture, this was reduced to near-anatomic alignment, over time there was some radial settling, and she continued to have pain. This led to an MRI arthrogram that showed TFCC injury, it also showed evidence of ongoing healing, but could represent a malunion. Her TFCC pain responded well to a injection, unfortunately she returned at the last month with persistent pain dorsally, she did have a radiocarpal joint effusion which was injected, this pain is gone but she continues to have pain over the distal radius near the fracture.  Past medical history:  Negative.  See flowsheet/record as well for more information.  Surgical history: Negative.  See flowsheet/record as well for more information.  Family history: Negative.  See flowsheet/record as well for more information.  Social history: Negative.  See flowsheet/record as well for more information.  Allergies, and medications have been entered into the medical record, reviewed, and no changes needed.   Review of Systems: No fevers, chills, night sweats, weight loss, chest pain, or shortness of breath.   Objective:    General: Well Developed, well nourished, and in no acute distress.  Neuro: Alert and oriented x3, extra-ocular muscles intact, sensation grossly intact.  HEENT: Normocephalic, atraumatic, pupils equal round reactive to light, neck supple, no masses, no lymphadenopathy, thyroid nonpalpable.  Skin: Warm and dry, no rashes. Cardiac: Regular rate and rhythm, no murmurs rubs or gallops, no lower extremity edema.  Respiratory: Clear to auscultation bilaterally. Not using accessory muscles, speaking in full sentences. Left wrist: Only minimal residual deformity, she does have some pain over the distal radius but not so much over the radiocarpal joint.  Impression and Recommendations:    Closed  fracture of left distal radius Persistent pain over the dorsal radius after closed reduction approximately a year ago, we had good reduction of fragments but over the next few months there was some settling of the radius with increased ulnar variance. We have tried there is forms of mobilization, pain control, therapy without much improvement. Subsequent MRIs including arthrogram showed injury to the TFCC which responded well to injection, but persistent and incomplete healing of the distal radius fracture. At the last visit she did have a radiocarpal joint effusion on ultrasound, this was injected as well and she had some relief but continues to have pain over the distal radius but not so much over the radiocarpal joint itself. Considering MRI findings this likely represents a malunion and I would like her to touch base with hand surgery.  I spent 25 minutes with this patient, greater than 50% was face-to-face time counseling regarding the above diagnoses

## 2017-06-13 IMAGING — MR MR WRIST*L* W/O CM
4 of 5 series · 19 of 40 positions shown · non-contrast
Comparison: Multiple prior plain films, last 07/17/2016.

CLINICAL DATA: History after left distal radius fracture fusion at
05/13/2016 continued laminar left-side wrist pain flexion-extension.

EXAM:
MR OF THE LEFT WRIST WITHOUT CONTRAST
TECHNIQUE: Multiplanar, multisequence MR imaging of the left wrist was
performed. No intravenous contrast was administered.

[Series 2: T2 fat-sat · axial · 3.0mm · 0.21mm/px · z∈[-39,+8]mm · 3 of 19 slices shown (1 of 2)]
[im 3/19]
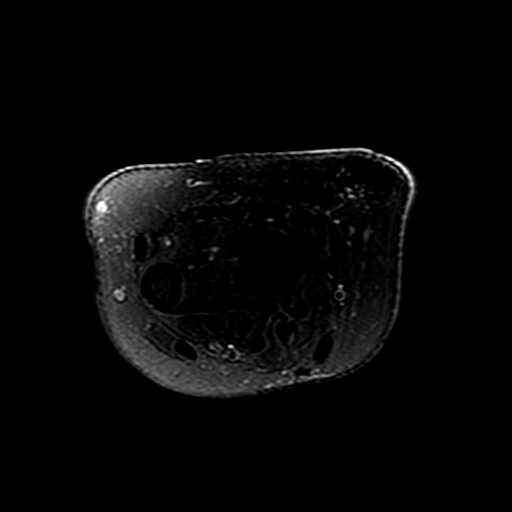
[im 11/19]
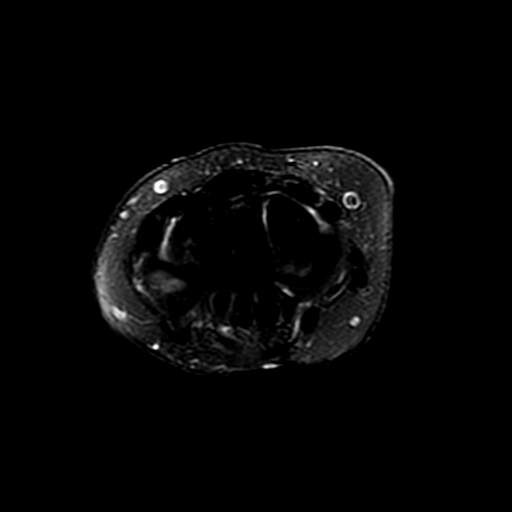
[im 16/19]
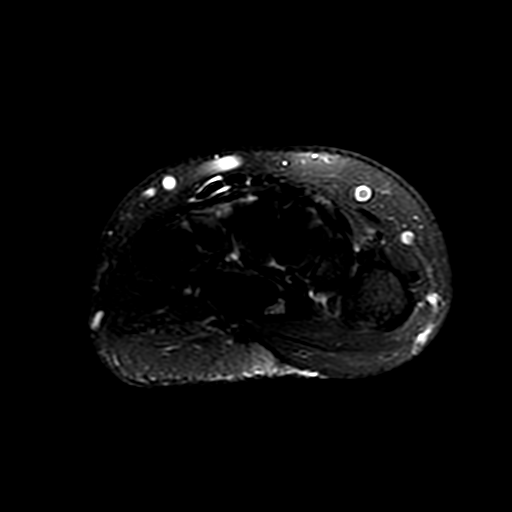

[Series 4: T2 fat-sat · coronal · 3.0mm · 0.23mm/px · 3 of 15 slices shown (2 of 2)]
[im 3/15]
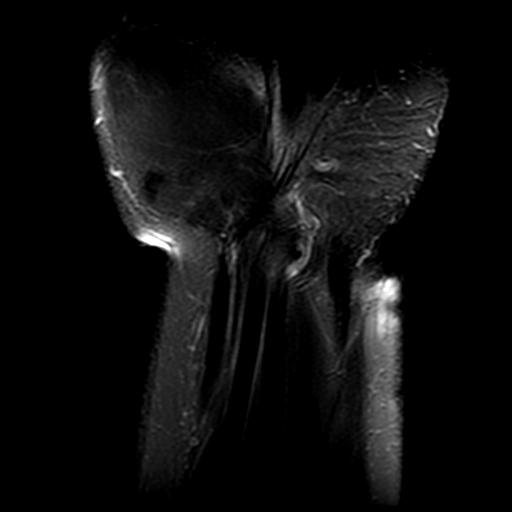
[im 8/15]
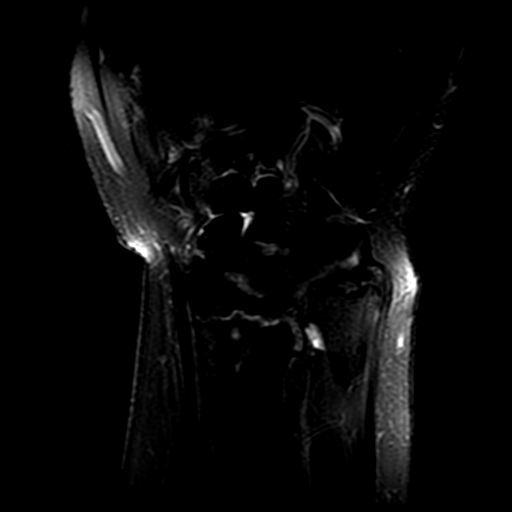
[im 12/15]
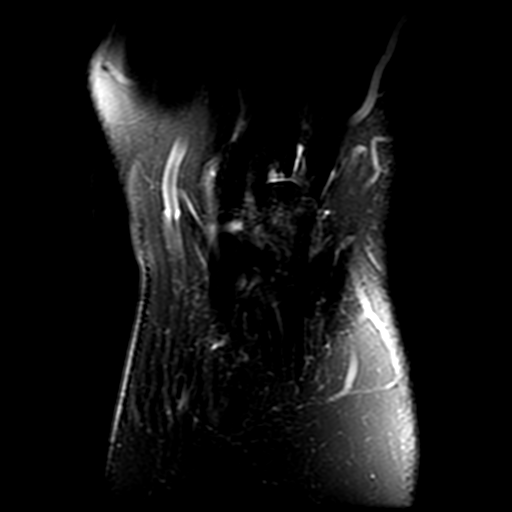

[Series 5: PD fat-sat · coronal · 3.0mm · 0.23mm/px · 7 of 15 slices shown (1 of 2)]
[im 1/15]
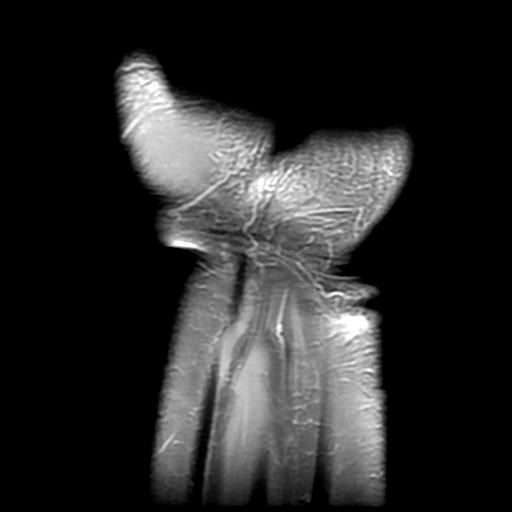
[im 3/15]
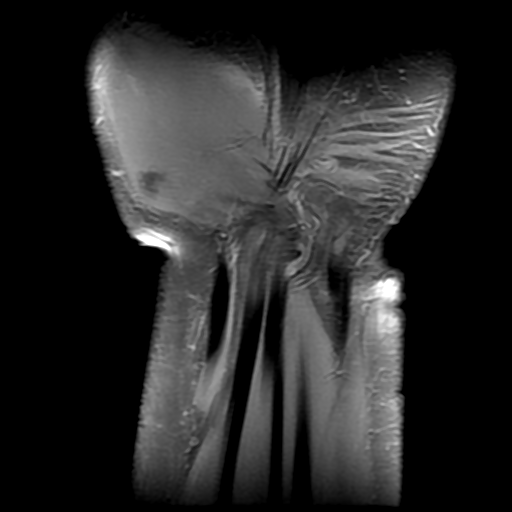
[im 5/15]
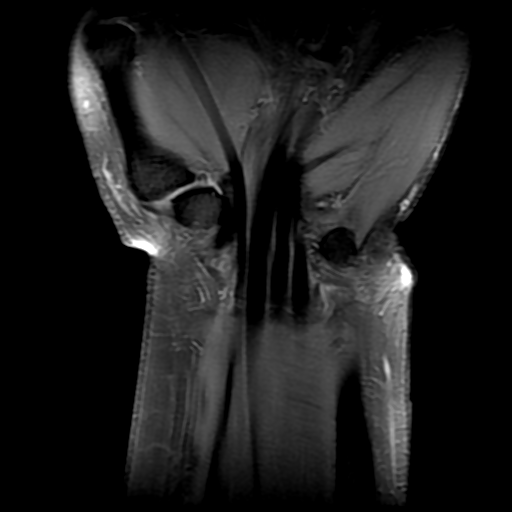
[im 8/15]
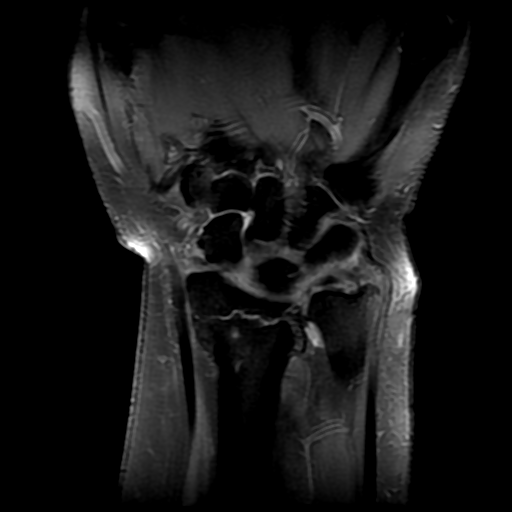
[im 10/15]
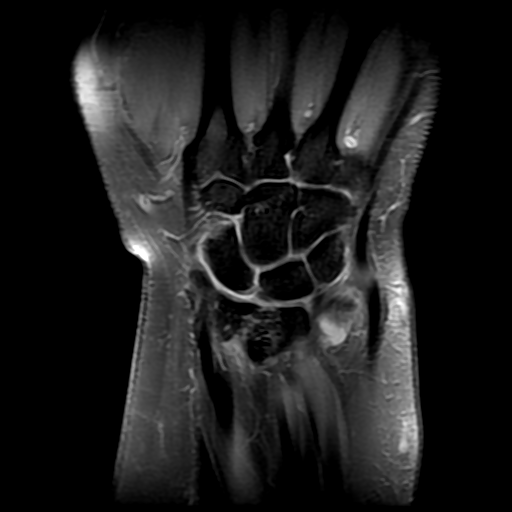
[im 12/15]
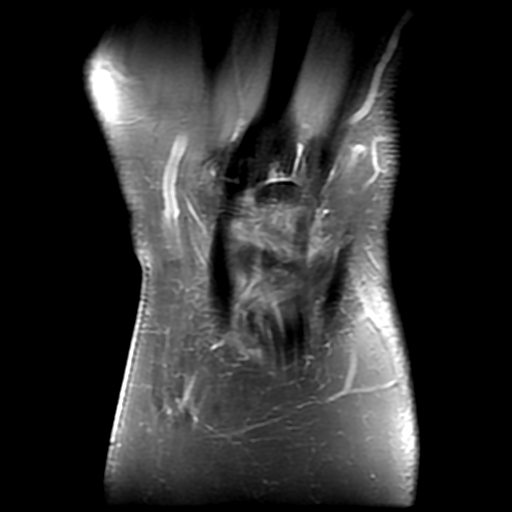
[im 15/15]
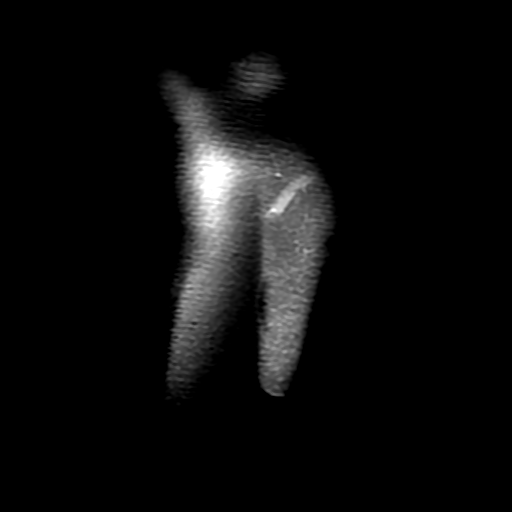

[Series 6: PD fat-sat · sagittal · 3.0mm · 0.23mm/px · 6 of 25 slices shown (2 of 2)]
[im 1/25]
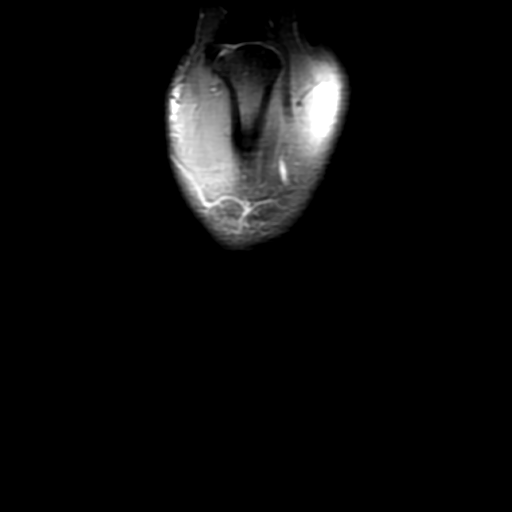
[im 3/25]
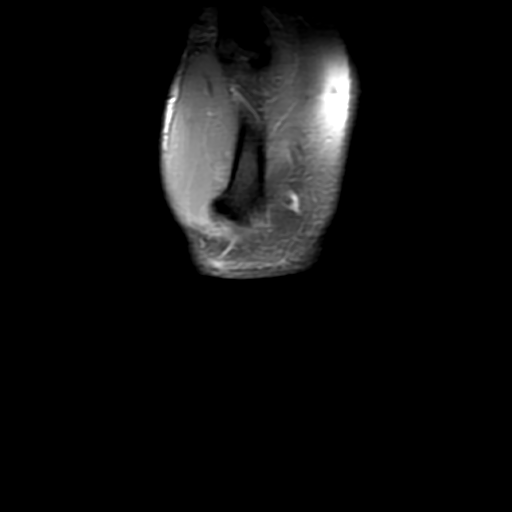
[im 5/25]
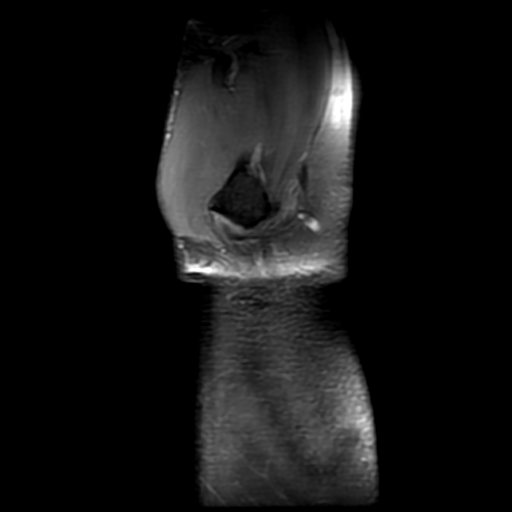
[im 8/25]
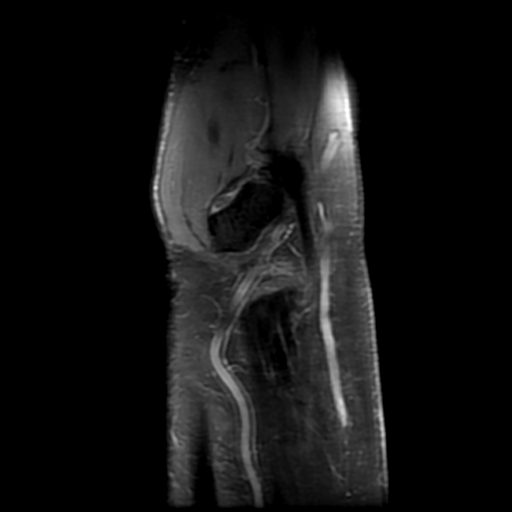
[im 13/25]
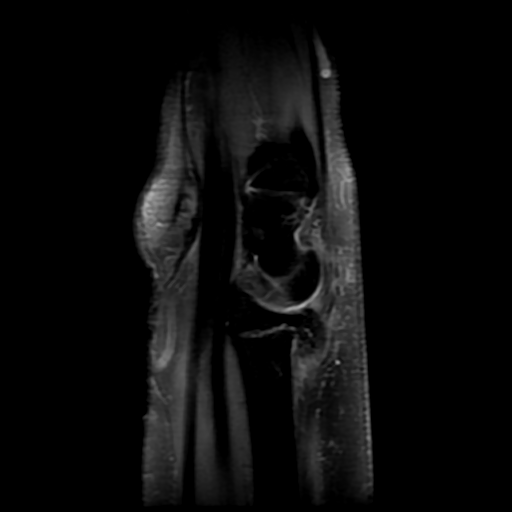
[im 22/25]
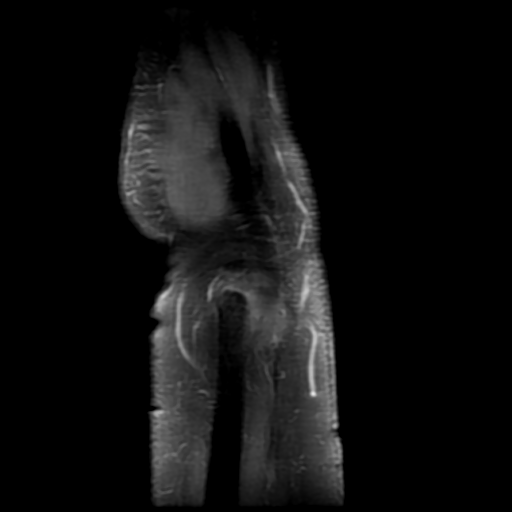

[19 of 40 positions shown; findings below may reference images not displayed]

FINDINGS: Ligaments: Intact.

Triangular fibrocartilage: The disc of the triangular fibrocartilage
is markedly thinned and likely torn.

Tendons: Intact flexor and extensor compartment tendons.

Carpal tunnel/median nerve: Normal carpal tunnel. Normal median
nerve.

Guyon's canal: Normal.

Joint/cartilage: Mild first CMC osteoarthritis is identified.

Bones/carpal alignment: The patient has a distal radius fracture
seen on prior examinations with impaction dorsally and
intra-articular extension. There are areas of bridging bone about
the fracture but fracture lines remain visible. There is no notable
marrow edema about the fracture. Impaction results in ulnar positive
variance. No abnormal signal is seen in the lunate. Carpal alignment
is maintained.

Other: None.
IMPRESSION: Dorsally impacted, intra-articular distal radius fracture is a
partial union. Impaction results in ulnar positive variance without
evidence ulnocarpal abutment.

Likely tear of the disc of the triangular fibrocartilage.

Negative for ligament tear.

## 2017-06-25 ENCOUNTER — Encounter: Payer: Self-pay | Admitting: Sports Medicine

## 2017-06-29 MED ORDER — TRAMADOL HCL 50 MG PO TABS: ORAL_TABLET | ORAL | 0 refills | Status: AC

## 2017-07-21 ENCOUNTER — Encounter: Payer: Self-pay | Admitting: Sports Medicine

## 2024-05-31 ENCOUNTER — Other Ambulatory Visit: Payer: Self-pay | Admitting: Medical Genetics

## 2024-08-16 ENCOUNTER — Other Ambulatory Visit: Payer: Self-pay | Admitting: Medical Genetics

## 2024-08-16 DIAGNOSIS — Z006 Encounter for examination for normal comparison and control in clinical research program: Secondary | ICD-10-CM
